# Patient Record
Sex: Male | Born: 1990 | Hispanic: No | Marital: Married | Smoking: Current some day smoker
Health system: Southern US, Community
[De-identification: ages and names within clinical notes are randomized; demographics above are authoritative.]

## PROBLEM LIST (undated history)

## (undated) DIAGNOSIS — J45909 Unspecified asthma, uncomplicated: Secondary | ICD-10-CM

## (undated) DIAGNOSIS — T783XXA Angioneurotic edema, initial encounter: Secondary | ICD-10-CM

## (undated) HISTORY — PX: HERNIA REPAIR: SHX51

## (undated) HISTORY — DX: Unspecified asthma, uncomplicated: J45.909

## (undated) HISTORY — DX: Angioneurotic edema, initial encounter: T78.3XXA

---
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
## 2008-04-03 IMAGING — CR DG WRIST COMPLETE 3+V*L*
4 series · 4 of 4 positions shown · non-contrast
Comparison: none

CLINICAL DATA: 16 year-old, arm pain. History of deformity left arm, injured playing soccer 2 days ago.
 LEFT WRIST ? 4 VIEW:

[x wrist pa left]
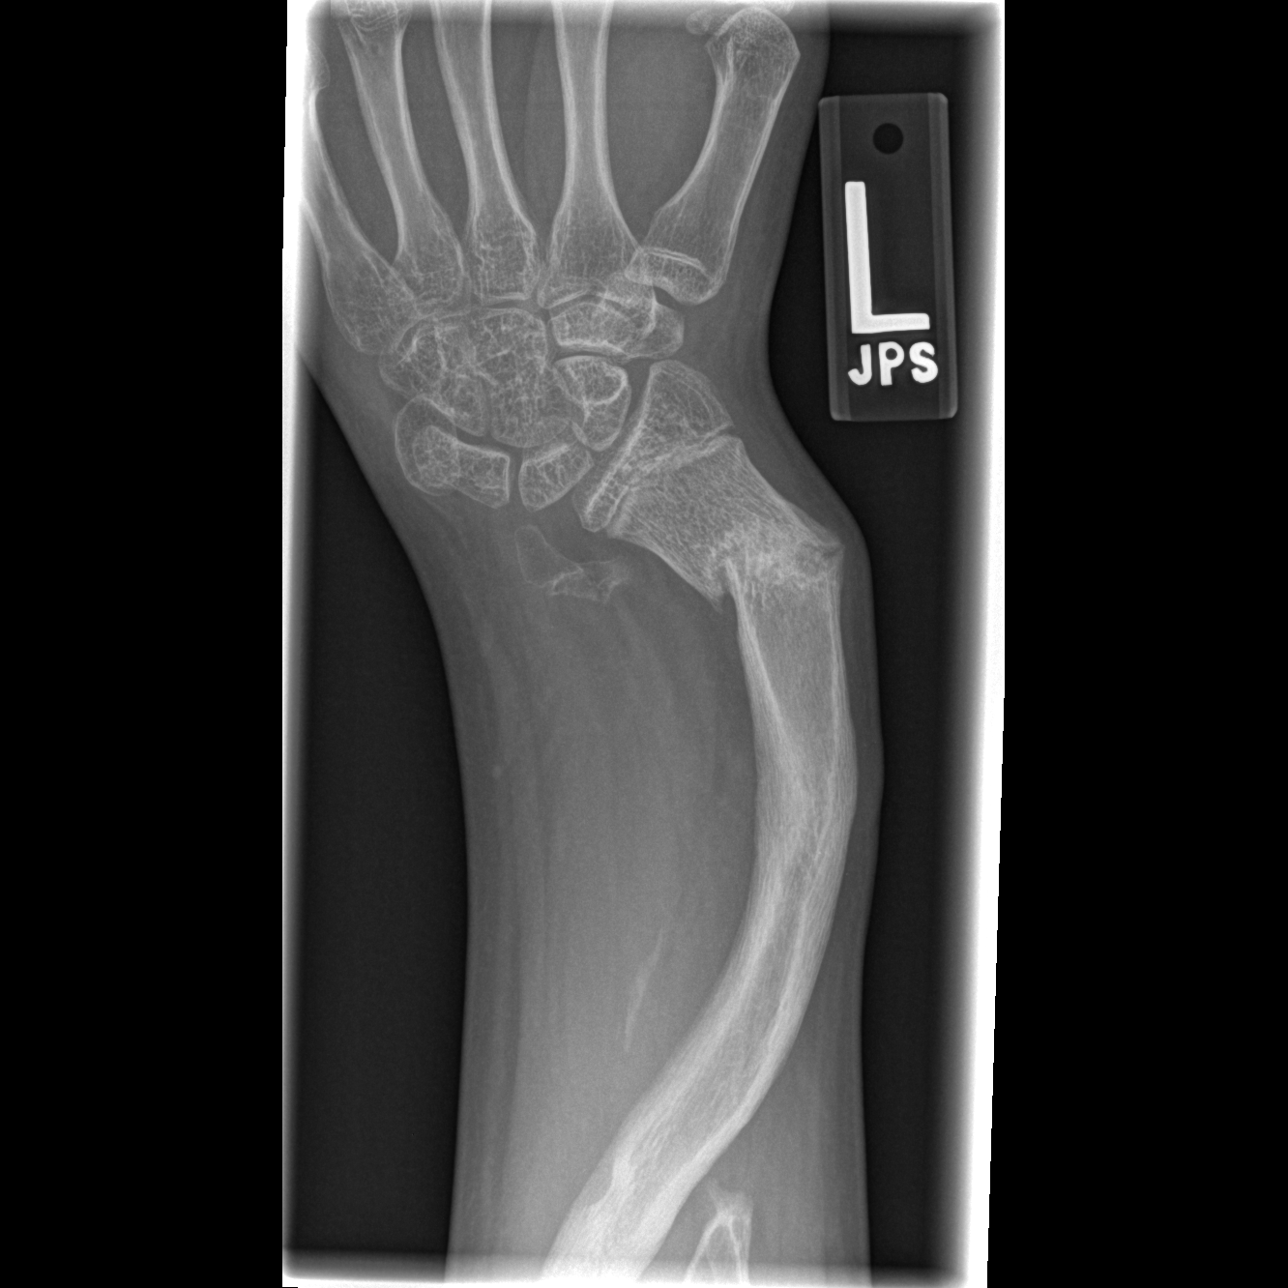

[x wrist obl left]
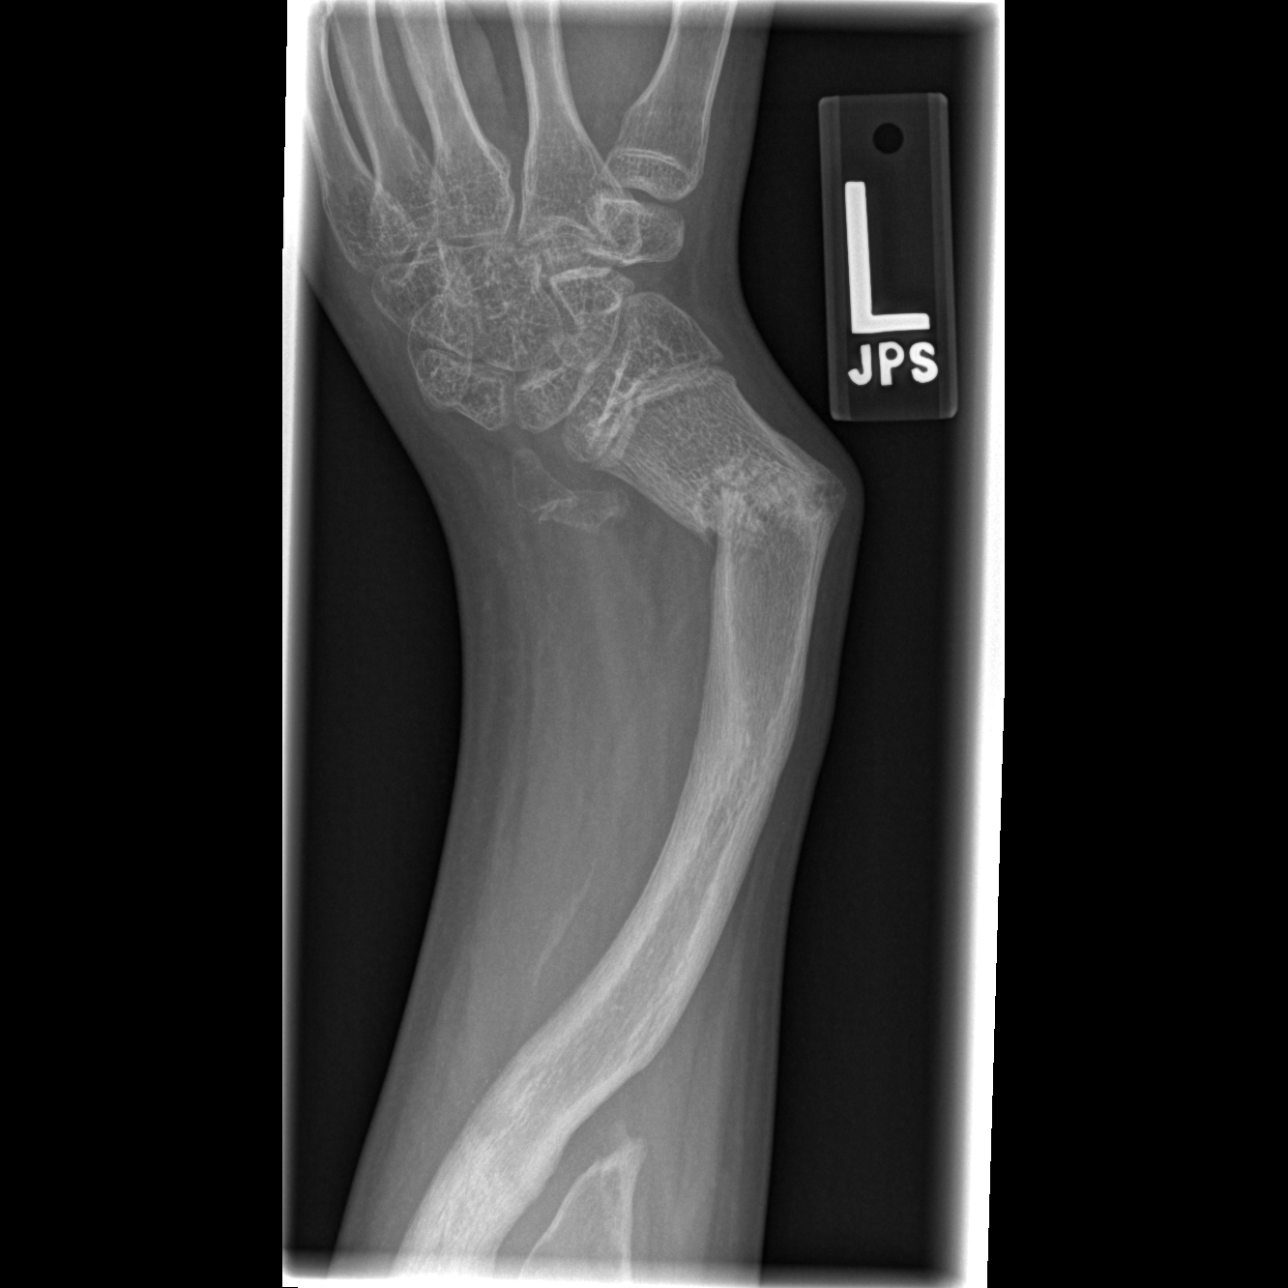

[x wrist lat left]
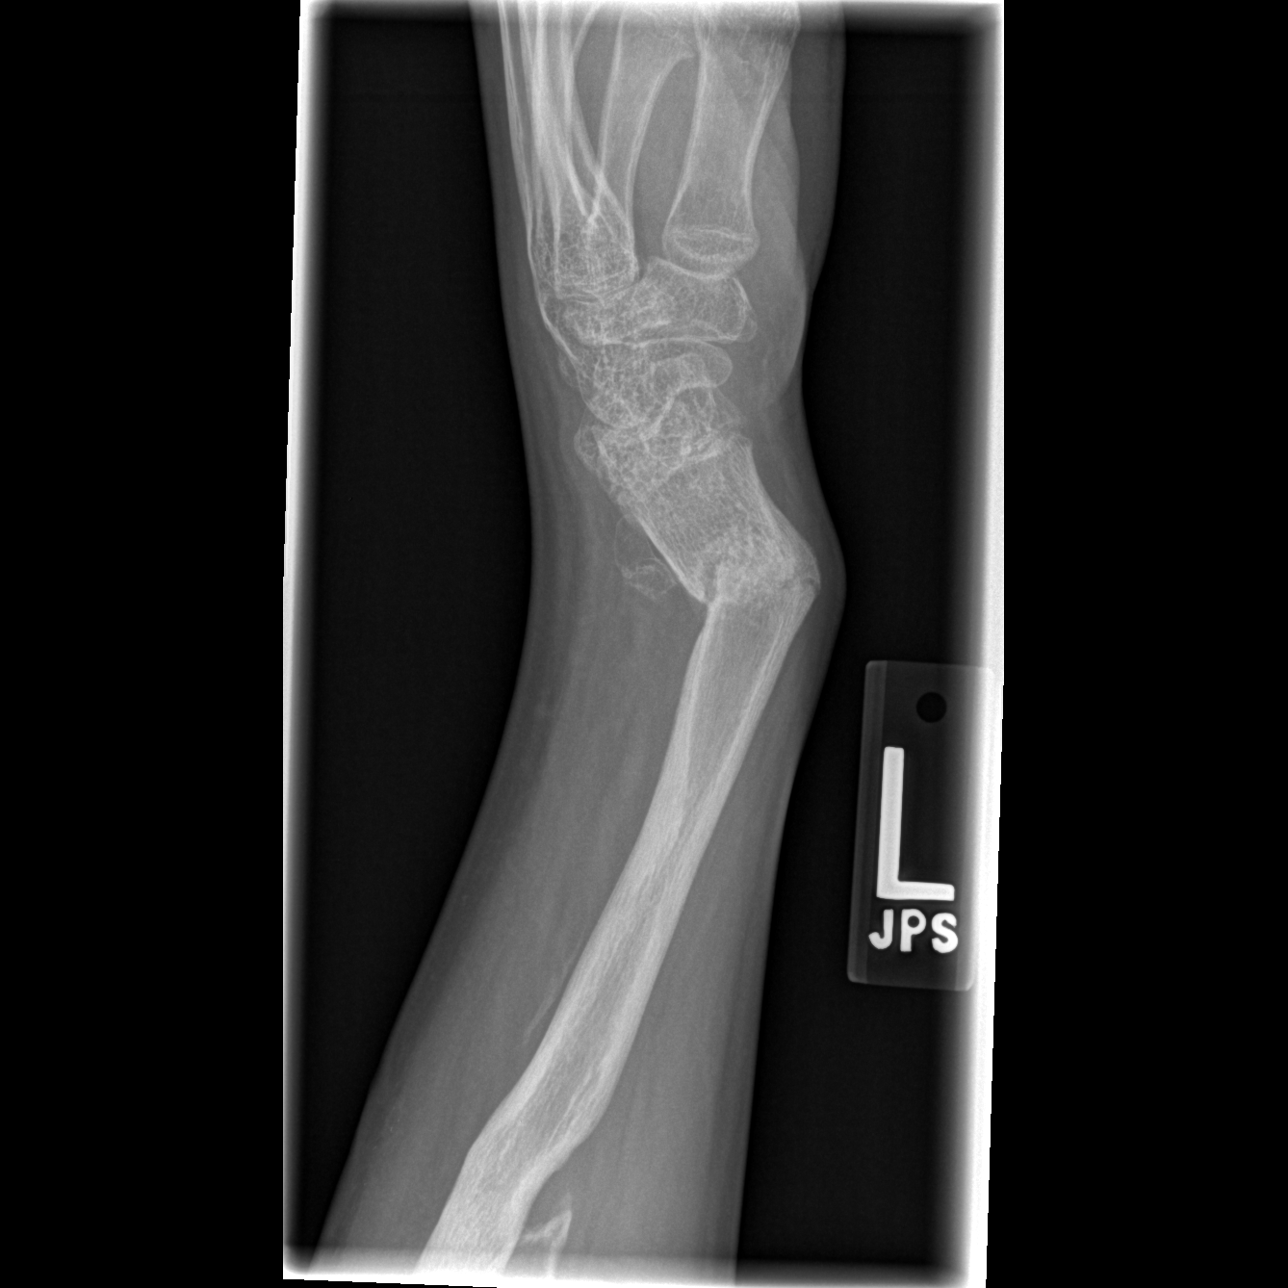

[x navicular]
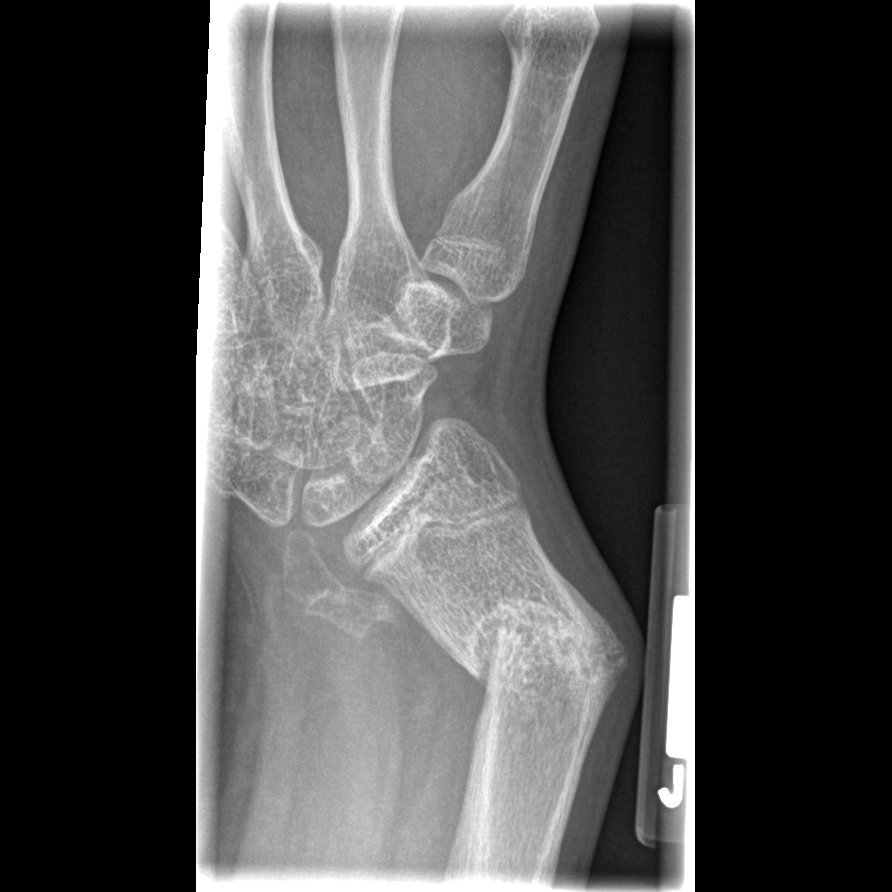

[4 of 4 positions shown; findings below may reference images not displayed]

FINDINGS: There is a severe curvature of the radius and only a small portion of the distal ulna is present.  There is a remote healed fracture of the distal radius.  No acute bony findings.
IMPRESSION: Congenital deformities of the forearm and wrist.  There is a remote healed fracture of the distal radius.  No acute bony findings.

## 2011-11-12 ENCOUNTER — Ambulatory Visit: Payer: Self-pay | Admitting: Family Medicine

## 2011-11-12 DIAGNOSIS — Z0000 Encounter for general adult medical examination without abnormal findings: Secondary | ICD-10-CM

## 2011-11-12 MED ORDER — MELOXICAM 7.5 MG PO TABS: 7.5000 mg | ORAL_TABLET | Freq: Two times a day (BID) | ORAL | Status: AC

## 2011-11-12 NOTE — Progress Notes (Signed)
  Subjective:    Patient ID: Adam Mcdaniel, male    DOB: [birth year], 21 y.o.   MRN: [id number]  HPI  Patient presents for CPE  Complains of (L) sided chest pain that is fleeting and intermitant. Pain started after lifting heavy palettes 3 months ago. Denies progression or radiation of symptoms No SOB, DOE, orthopnea, presyncope or lower extremity edema.  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  HENT:  Head: Normocephalic.  Right Ear: Hearing, tympanic membrane and external ear normal.  Left Ear: Hearing, tympanic membrane and external ear normal.  Nose: Nose normal.  Mouth/Throat: Oropharynx is clear and moist and mucous membranes are normal.  Eyes: EOM are normal. Pupils are equal, round, and reactive to light.  Neck: Normal range of motion. Neck supple. No thyromegaly present.  Cardiovascular: Normal rate, regular rhythm, normal heart sounds and normal pulses.   Pulmonary/Chest: Effort normal and breath sounds normal.  Abdominal: Soft. Bowel sounds are normal. There is no hepatosplenomegaly. Hernia confirmed negative in the right inguinal area and confirmed negative in the left inguinal area.  Genitourinary: Testes normal and penis normal. Right testis shows no mass. Left testis shows no mass.  Musculoskeletal: Normal range of motion.  Lymphadenopathy:    He has no cervical adenopathy.  Neurological: He is alert. He has normal strength. No cranial nerve deficit or sensory deficit.  Skin: Skin is warm.  Psychiatric: He has a normal mood and affect.    EKG NSR    Assessment & Plan:  CPE 1. Chest wall pain, musculoskeletal EKG 12-Lead, meloxicam (MOBIC) 7.5 MG tablet   Anticipatory  guidance

## 2012-04-29 ENCOUNTER — Ambulatory Visit: Payer: Self-pay | Admitting: Internal Medicine

## 2012-04-29 VITALS — BP 98/56 | HR 54 | Temp 97.8°F | Resp 14 | Ht 66.0 in | Wt 123.4 lb

## 2012-04-29 DIAGNOSIS — K469 Unspecified abdominal hernia without obstruction or gangrene: Secondary | ICD-10-CM

## 2012-04-29 NOTE — Patient Instructions (Addendum)
Inguinal Hernia, Adult Muscles help keep everything in the body in its proper place. But if a weak spot in the muscles develops, something can poke through. That is called a hernia. When this happens in the lower part of the belly (abdomen), it is called an inguinal hernia. (It takes its name from a part of the body in this region called the inguinal canal.) A weak spot in the wall of muscles lets some fat or part of the small intestine bulge through. An inguinal hernia can develop at any age. Men get them more often than women. CAUSES  In adults, an inguinal hernia develops over time.  It can be triggered by:   Suddenly straining the muscles of the lower abdomen.   Lifting heavy objects.   Straining to have a bowel movement. Difficult bowel movements (constipation) can lead to this.   Constant coughing. This may be caused by smoking or lung disease.   Being overweight.   Being pregnant.   Working at a job that requires long periods of standing or heavy lifting.   Having had an inguinal hernia before.  One type can be an emergency situation. It is called a strangulated inguinal hernia. It develops if part of the small intestine slips through the weak spot and cannot get back into the abdomen. The blood supply can be cut off. If that happens, part of the intestine may die. This situation requires emergency surgery. SYMPTOMS  Often, a small inguinal hernia has no symptoms. It is found when a healthcare provider does a physical exam. Larger hernias usually have symptoms.   In adults, symptoms may include:   A lump in the groin. This is easier to see when the person is standing. It might disappear when lying down.   In men, a lump in the scrotum.   Pain or burning in the groin. This occurs especially when lifting, straining or coughing.   A dull ache or feeling of pressure in the groin.   Signs of a strangulated hernia can include:   A bulge in the groin that becomes very painful  and tender to the touch.   A bulge that turns red or purple.   Fever, nausea and vomiting.   Inability to have a bowel movement or to pass gas.  DIAGNOSIS  To decide if you have an inguinal hernia, a healthcare provider will probably do a physical examination.  This will include asking questions about any symptoms you have noticed.   The healthcare provider might feel the groin area and ask you to cough. If an inguinal hernia is felt, the healthcare provider may try to slide it back into the abdomen.   Usually no other tests are needed.  TREATMENT  Treatments can vary. The size of the hernia makes a difference. Options include:  Watchful waiting. This is often suggested if the hernia is small and you have had no symptoms.   No medical procedure will be done unless symptoms develop.   You will need to watch closely for symptoms. If any occur, contact your healthcare provider right away.   Surgery. This is used if the hernia is larger or you have symptoms.   Open surgery. This is usually an outpatient procedure (you will not stay overnight in a hospital). An cut (incision) is made through the skin in the groin. The hernia is put back inside the abdomen. The weak area in the muscles is then repaired by herniorrhaphy or hernioplasty. Herniorrhaphy: in this type of   surgery, the weak muscles are sewn back together. Hernioplasty: a patch or mesh is used to close the weak area in the abdominal wall.   Laparoscopy. In this procedure, a surgeon makes small incisions. A thin tube with a tiny video camera (called a laparoscope) is put into the abdomen. The surgeon repairs the hernia with mesh by looking with the video camera and using two long instruments.  HOME CARE INSTRUCTIONS   After surgery to repair an inguinal hernia:   You will need to take pain medicine prescribed by your healthcare provider. Follow all directions carefully.   You will need to take care of the wound from the incision.    Your activity will be restricted for awhile. This will probably include no heavy lifting for several weeks. You also should not do anything too active for a few weeks. When you can return to work will depend on the type of job that you have.   During "watchful waiting" periods, you should:   Maintain a healthy weight.   Eat a diet high in fiber (fruits, vegetables and whole grains).   Drink plenty of fluids to avoid constipation. This means drinking enough water and other liquids to keep your urine clear or pale yellow.   Do not lift heavy objects.   Do not stand for long periods of time.   Quit smoking. This should keep you from developing a frequent cough.  SEEK MEDICAL CARE IF:   A bulge develops in your groin area.   You feel pain, a burning sensation or pressure in the groin. This might be worse if you are lifting or straining.   You develop a fever of more than 100.5 F (38.1 C).  SEEK IMMEDIATE MEDICAL CARE IF:   Pain in the groin increases suddenly.   A bulge in the groin gets bigger suddenly and does not go down.   For men, there is sudden pain in the scrotum. Or, the size of the scrotum increases.   A bulge in the groin area becomes red or purple and is painful to touch.   You have nausea or vomiting that does not go away.   You feel your heart beating much faster than normal.   You cannot have a bowel movement or pass gas.   You develop a fever of more than 102.0 F (38.9 C).  Document Released: 12/20/2008 Document Revised: 07/23/2011 Document Reviewed: 12/20/2008 [facility] Patient Information 2012 [place], [place].Hernia inguinal en adultos  Cuidados posteriores (Inguinal Hernia, Adult, Care After) Consulte este texto durante las prximas semanas. Estas indicaciones para despus de recibir el alta le proporcionan informacin general acerca de cmo deber cuidarse despus de dejar el hospital. El mdico le dar instrucciones especficas. El tratamiento se ha  planificado de acuerdo con las prcticas mdicas disponibles ms recientes, pero ocasionalmente pueden ocurrir complicaciones inevitables. Si tiene problemas o surgen preguntas luego de recibir el alta, por favor comunquese con su mdico. INSTRUCCIONES PARA EL CUIDADO DOMICILIARIO  Aplique hielo en el lugar de la operacin.   Ponga el hielo en una bolsa plstica.   Colquese una toalla entre la piel y la bolsa de hielo.   Deje el hielo durante 15 a 20 minutos por vez, 3 a 4 veces por da, mientras est despierto.   Cambie el vendaje tal como se le indic.   Mantenga la herida limpia y seca. Lave suavemente la herida con agua y [place]. Seque la herida con suaves toques. Puede tomar duchas 24 a 48  horas luego de Metallurgist. No tome baos, no utilice piscinas ni baeras durante [address], o segn las indicaciones del mdico.   Solo tome medicamentos de venta libre o recetados para Chief Technology Officer, Environmental health practitioner o la [place], segn le haya indicado el mdico.   Retome su dieta normal cuando le indiquen.   No levante objetos que pesen ms de 4 kg ni realice deportes de contacto durante 3 semanas, o segn las indicaciones.  SOLICITE ATENCIN MDICA SI:  Presenta enrojecimiento, hinchazn o aumento del dolor en la herida.   Observa lquido (pus) en la zona de la herida.   Hay un drenaje en la herida que dura ms de Civil engineer, contracting.   La temperatura oral se eleva sin motivo por arriba de 102 F (38.9 C).   Advierte un olor ftido que proviene de la herida o del vendaje.   La herida se abre luego de que le han extrado los puntos (suturas).   Nota un incremento del dolor en los hombros (en la zona donde van los tirantes).   Presenta episodios de mareos o se siente dbil cuando est de pie.   Si tiene Programme researcher, broadcasting/film/video (nuseas) o vmitos.  SOLICITE ATENCIN MDICA INMEDIATAMENTE SI:  Aparece una erupcin cutnea.   Presenta dificultades respiratorias.   Aparece alguna reaccin o efecto secundario  por los medicamentos administrados.  ASEGRESE QUE:  Comprende estas instrucciones.   Controlar su enfermedad.   Solicitar ayuda de inmediato si no mejora o empeora.  Document Released: 01/21/2010 Document Revised: 07/23/2011 [facility] Patient Information 2012 [place], [place].

## 2012-04-29 NOTE — Progress Notes (Signed)
  Subjective:    Patient ID: Adam Mcdaniel, male    DOB: 1991-06-12, 21 y.o.   MRN: [id number]  HPI Has right inguinal hernia   Review of Systems     Objective:   Physical Exam Right inguinal hernia, reducible      Assessment & Plan:  Refer to surgery Counceled

## 2012-05-13 ENCOUNTER — Encounter (INDEPENDENT_AMBULATORY_CARE_PROVIDER_SITE_OTHER): Payer: Self-pay | Admitting: General Surgery

## 2012-05-13 ENCOUNTER — Ambulatory Visit (INDEPENDENT_AMBULATORY_CARE_PROVIDER_SITE_OTHER): Payer: Self-pay | Admitting: General Surgery

## 2012-05-13 VITALS — BP 118/66 | HR 71 | Temp 98.2°F | Ht 68.0 in | Wt 120.0 lb

## 2012-05-13 DIAGNOSIS — K4090 Unilateral inguinal hernia, without obstruction or gangrene, not specified as recurrent: Secondary | ICD-10-CM

## 2012-05-13 NOTE — Progress Notes (Signed)
Subjective:     Patient ID: Adam Mcdaniel, male   DOB: 1991-08-10, 21 y.o.   MRN: [id number]  HPI The patient is a 21 year old male with a several week history of a right groin pain. Patient was noticed to have a right inguinal hernia by his PCP. This patient was scheduled for elective clinic appointment and possible fixation. The patient states he notices the pain and a bulge mainly after standing all day.  The patient states that upon awakening he does not notice bulge there.  Review of Systems  Constitutional: Negative.   HENT: Negative.   Eyes: Negative.   Respiratory: Negative.   Cardiovascular: Negative.   Gastrointestinal: Negative.   Musculoskeletal: Negative.   Neurological: Negative.        Objective:   Physical Exam  Constitutional: He is oriented to person, place, and time. He appears well-developed and well-nourished.  HENT:  Head: Normocephalic and atraumatic.  Eyes: Conjunctivae normal are normal. Pupils are equal, round, and reactive to light.  Neck: Normal range of motion. Neck supple.  Cardiovascular: Normal rate and regular rhythm.   Pulmonary/Chest: Effort normal.  Abdominal: Soft.  Musculoskeletal: Normal range of motion.  Neurological: He is alert and oriented to person, place, and time.       Assessment:     The patient is a 21 year old male with a right inguinal hernia. We discussed the risks and benefits of a right inguinal hernia, Open versus laparoscopic, patient wishes to proceed at this time hernia fixation.    Plan:     1. Schedule the patient for leftovers open repair with mesh.  2. All risks and benefits were discussed with the patient, to generally include infection, bleeding, damage to surrounding structures, and recurrence. Alternatives were offered and described.  All questions were answered and the patient voiced understanding of the procedure and wishes to proceed at this point.

## 2012-12-15 ENCOUNTER — Encounter (INDEPENDENT_AMBULATORY_CARE_PROVIDER_SITE_OTHER): Payer: Self-pay | Admitting: General Surgery

## 2012-12-15 ENCOUNTER — Ambulatory Visit (INDEPENDENT_AMBULATORY_CARE_PROVIDER_SITE_OTHER): Payer: Self-pay | Admitting: General Surgery

## 2012-12-15 VITALS — BP 110/62 | HR 84 | Resp 18 | Ht 68.0 in | Wt 118.2 lb

## 2012-12-15 DIAGNOSIS — K4090 Unilateral inguinal hernia, without obstruction or gangrene, not specified as recurrent: Secondary | ICD-10-CM

## 2012-12-15 NOTE — Progress Notes (Signed)
Patient ID: Adam Mcdaniel, male   DOB: 02-Jan-1991, 22 y.o.   MRN: [id number] HPI  The patient is a 22 year old male with a several week history of a right groin pain. Patient was noticed to have a right inguinal hernia by his PCP. This patient was scheduled for elective clinic appointment and possible fixation. The patient states he notices the pain and a bulge mainly after standing all day. The patient states that upon awakening he does not notice bulge there.  Review of Systems  Constitutional: Negative.  HENT: Negative.  Eyes: Negative.  Respiratory: Negative.  Cardiovascular: Negative.  Gastrointestinal: Negative.  Musculoskeletal: Negative.  Neurological: Negative.  Objective:   Physical Exam  Constitutional: He is oriented to person, place, and time. He appears well-developed and well-nourished.  HENT:  Head: Normocephalic and atraumatic.  Eyes: Conjunctivae normal are normal. Pupils are equal, round, and reactive to light.  Neck: Normal range of motion. Neck supple.  Cardiovascular: Normal rate and regular rhythm.  Pulmonary/Chest: Effort normal.  Abdominal: Soft.  Musculoskeletal: Normal range of motion.  Neurological: He is alert and oriented to person, place, and time.  Assessment:   The patient is a 22 year old male with a right inguinal hernia. We discussed the risks and benefits of a right inguinal hernia, Open versus laparoscopic, patient wishes to proceed at this time hernia fixation.  Plan:   1. Schedule the patient for Lap LIHR  with mesh.  2.All risks and benefits were discussed with the patient, to generally include infection, bleeding, damage to surrounding structures, acute and chronic nerve pain, and recurrence. Alternatives were offered and described.  All questions were answered and the patient voiced understanding of the procedure and wishes to proceed at this point.

## 2012-12-15 NOTE — Progress Notes (Deleted)
Patient ID: Adam Mcdaniel, male   DOB: 06-10-1991, 22 y.o.   MRN: [id number]  No chief complaint on file.   HPI Adam Mcdaniel is a 22 y.o. male.  *** HPI  History reviewed. No pertinent past medical history.  History reviewed. No pertinent past surgical history.  History reviewed. No pertinent family history.  Social History History  Substance Use Topics  . Smoking status: Never Smoker   . Smokeless tobacco: Not on file  . Alcohol Use: No    No Known Allergies  No current outpatient prescriptions on file.   No current facility-administered medications for this visit.    Review of Systems Review of Systems  Blood pressure 110/62, pulse 84, resp. rate 18, height 5\' 8"  (1.727 m), weight 118 lb 3.2 oz (53.615 kg).  Physical Exam Physical Exam  Data Reviewed ***  Assessment    ***    Plan    ***       Marigene Ehlers., Paiden Cavell 12/15/2012, 12:07 PM

## 2017-10-28 ENCOUNTER — Encounter: Payer: Self-pay | Admitting: Urgent Care

## 2017-10-28 ENCOUNTER — Ambulatory Visit: Payer: Self-pay | Admitting: Urgent Care

## 2017-10-28 VITALS — BP 122/75 | HR 56 | Temp 98.2°F | Resp 16 | Ht 68.0 in | Wt 143.0 lb

## 2017-10-28 DIAGNOSIS — R350 Frequency of micturition: Secondary | ICD-10-CM

## 2017-10-28 DIAGNOSIS — Z8719 Personal history of other diseases of the digestive system: Secondary | ICD-10-CM

## 2017-10-28 DIAGNOSIS — F524 Premature ejaculation: Secondary | ICD-10-CM

## 2017-10-28 LAB — POCT URINALYSIS DIP (MANUAL ENTRY)
BILIRUBIN UA: NEGATIVE mg/dL
Bilirubin, UA: NEGATIVE
Glucose, UA: NEGATIVE mg/dL
LEUKOCYTES UA: NEGATIVE
NITRITE UA: NEGATIVE
PROTEIN UA: NEGATIVE mg/dL
RBC UA: NEGATIVE
Spec Grav, UA: 1.015 (ref 1.010–1.025)
Urobilinogen, UA: 0.2 E.U./dL
pH, UA: 7.5 (ref 5.0–8.0)

## 2017-10-28 NOTE — Patient Instructions (Addendum)
Urinary Frequency, Adult Urinary frequency means urinating more often than usual. People with urinary frequency urinate at least 8 times in 24 hours, even if they drink a normal amount of fluid. Although they urinate more often than normal, the total amount of urine produced in a day may be normal. Urinary frequency is also called pollakiuria. What are the causes? This condition may be caused by:  A urinary tract infection.  Obesity.  Bladder problems, such as bladder stones.  Caffeine or alcohol.  Eating food or drinking fluids that irritate the bladder. These include coffee, tea, soda, artificial sweeteners, citrus, tomato-based foods, and chocolate.  Certain medicines, such as medicines that help the body get rid of extra fluid (diuretics).  Muscle or nerve weakness.  Overactive bladder.  Chronic diabetes.  Interstitial cystitis.  In men, problems with the prostate, such as an enlarged prostate.  In women, pregnancy.  In some cases, the cause may not be known. What increases the risk? This condition is more likely to develop in:  Women who have gone through menopause.  Men with prostate problems.  People with a disease or injury that affects the nerves or spinal cord.  People who have or have had a condition that affects the brain, such as a stroke.  What are the signs or symptoms? Symptoms of this condition include:  Feeling an urgent need to urinate often. The stress and anxiety of needing to find a bathroom quickly can make this urge worse.  Urinating 8 or more times in 24 hours.  Urinating as often as every 1 to 2 hours.  How is this diagnosed? This condition is diagnosed based on your symptoms, your medical history, and a physical exam. You may have tests, such as:  Blood tests.  Urine tests.  Imaging tests, such as X-rays or ultrasounds.  A bladder test.  A test of your neurological system. This is the body system that senses the need to  urinate.  A test to check for problems in the urethra and bladder called cystoscopy.  You may also be asked to keep a bladder diary. A bladder diary is a record of what you eat and drink, how often you urinate, and how much you urinate. You may need to see a health care provider who specializes in conditions of the urinary tract (urologist) or kidneys (nephrologist). How is this treated? Treatment for this condition depends on the cause. Sometimes the condition goes away on its own and treatment is not necessary. If treatment is needed, it may include:  Taking medicine.  Learning exercises that strengthen the muscles that help control urination.  Following a bladder training program. This may include: ? Learning to delay going to the bathroom. ? Double urinating (voiding). This helps if you are not completely emptying your bladder. ? Scheduled voiding.  Making diet changes, such as: ? Avoiding caffeine. ? Drinking fewer fluids, especially alcohol. ? Not drinking in the evening. ? Not having foods or drinks that may irritate the bladder. ? Eating foods that help prevent or ease constipation. Constipation can make this condition worse.  Having the nerves in your bladder stimulated. There are two options for stimulating the nerves to your bladder: ? Outpatient electrical nerve stimulation. This is done by your health care provider. ? Surgery to implant a bladder pacemaker. The pacemaker helps to control the urge to urinate.  Follow these instructions at home:  Keep a bladder diary if told to by your health care provider.  Take over-the-counter   and prescription medicines only as told by your health care provider.  Do any exercises as told by your health care provider.  Follow a bladder training program as told by your health care provider.  Make any recommended diet changes.  Keep all follow-up visits as told by your health care provider. This is important. Contact a health care  provider if:  You start urinating more often.  You feel pain or irritation when you urinate.  You notice blood in your urine.  Your urine looks cloudy.  You develop a fever.  You begin vomiting. Get help right away if:  You are unable to urinate. This information is not intended to replace advice given to you by your health care provider. Make sure you discuss any questions you have with your health care provider. Document Released: 05/30/2009 Document Revised: 09/04/2015 Document Reviewed: 02/27/2015 Elsevier Interactive Patient Education  2018 [employer]     IF you received an x-ray today, you will receive an invoice from [facility]. Please contact  Radiology at [phone] with questions or concerns regarding your invoice.   IF you received labwork today, you will receive an invoice from [facility]. Please contact [facility] at [phone] with questions or concerns regarding your invoice.   Our billing staff will not be able to assist you with questions regarding bills from these companies.  You will be contacted with the lab results as soon as they are available. The fastest way to get your results is to activate your My Chart account. Instructions are located on the last page of this paperwork. If you have not heard from us regarding the results in 2 weeks, please contact this office.      

## 2017-10-28 NOTE — Progress Notes (Signed)
    MRN: [id number] DOB: 01-16-1991  Subjective:   Adam Mcdaniel is a [age] y.o. male presenting for 3 year history of intermittent urinary frequency, premature ejaculation. Denies fever, polydipsia, n/v, abdominal pain, dysuria, hematuria, penile discharge, penile pain, testicular pain, genital rashes. Denies smoking cigarettes or drinking alcohol. He is sexually active with his wife only. Also reports that he has high sexual drive despite premature ejaculation and is very worried about his prostate. Would like to have this checked. Does not hydrate well. Drinks soda, coffee, sweet tea. Diet is a mix of foods.   Adam Mcdaniel is not currently taking any medications and has No Known Allergies.  Adam Mcdaniel has a history of right inguinal hernia, s/p inguinal hernia repair in 2014. Denies family history of cancer, diabetes, HTN, HL, heart disease, stroke, mental illness.   Objective:   Vitals: BP 122/75   Pulse (!) 56   Temp 98.2 F (36.8 C) (Oral)   Resp 16   Ht 5\' 8"  (1.727 m)   Wt 143 lb (64.9 kg)   SpO2 99%   BMI 21.74 kg/m   Physical Exam  Constitutional: He is oriented to person, place, and time. He appears well-developed and well-nourished.  HENT:  Mouth/Throat: Oropharynx is clear and moist.  Eyes: No scleral icterus.  Neck: Normal range of motion. Neck supple. No thyromegaly present.  Cardiovascular: Normal rate, regular rhythm and intact distal pulses. Exam reveals no gallop and no friction rub.  No murmur heard. Pulmonary/Chest: No respiratory distress. He has no wheezes. He has no rales.  Abdominal: He exhibits no distension and no mass. There is no tenderness. There is no guarding.  Genitourinary: Prostate is not enlarged and not tender.  Neurological: He is alert and oriented to person, place, and time.  Skin: Skin is warm and dry.  Psychiatric:  Anxious demeanor.   Results for orders placed or performed in visit on 10/28/17 (from the past 24 hour(s))  POCT urinalysis dipstick      Status: None   Collection Time: 10/28/17  5:03 PM  Result Value Ref Range   Color, UA yellow yellow   Clarity, UA clear clear   Glucose, UA negative negative mg/dL   Bilirubin, UA negative negative   Ketones, POC UA negative negative mg/dL   Spec Grav, UA [id number] 1.1911.010 - 1.025   Blood, UA negative negative   pH, UA 7.5 5.0 - 8.0   Protein Ur, POC negative negative mg/dL   Urobilinogen, UA 0.2 0.2 or 1.0 E.U./dL   Nitrite, UA Negative Negative   Leukocytes, UA Negative Negative   Assessment and Plan :   Urinary frequency - Plan: POCT urinalysis dipstick, Basic metabolic panel, Hemoglobin A1c, Urine Culture, GC/Chlamydia Probe Amp([facility])  Premature ejaculation - Plan: POCT urinalysis dipstick  History of inguinal hernia  Counseled patient on differential. Discussed indications for digital rectal exam but patient insisted due to concern of his prostate. Reassured patient on his exam. Labs pending, will have patient cut back on fluids that are urinary irritants. Recheck in 1 week. Return-to-clinic precautions discussed, patient verbalized understanding.   Wallis BambergMario Khilynn Borntreger, PA-C [facility] at [facility] [phone] 10/28/2017  4:35 PM

## 2017-10-29 LAB — BASIC METABOLIC PANEL
BUN/Creatinine Ratio: 12 (ref 9–20)
BUN: 9 mg/dL (ref 6–20)
CALCIUM: 9.6 mg/dL (ref 8.7–10.2)
CO2: 24 mmol/L (ref 20–29)
Chloride: 100 mmol/L (ref 96–106)
Creatinine, Ser: 0.74 mg/dL — ABNORMAL LOW (ref 0.76–1.27)
GFR calc non Af Amer: 125 mL/min/{1.73_m2} (ref >59)
GFR, EST AFRICAN AMERICAN: 144 mL/min/{1.73_m2} (ref >59)
Glucose: 84 mg/dL (ref 65–99)
POTASSIUM: 4.2 mmol/L (ref 3.5–5.2)
Sodium: 141 mmol/L (ref 134–144)

## 2017-10-29 LAB — GC/CHLAMYDIA PROBE AMP
Chlamydia trachomatis, NAA: NEGATIVE
Neisseria gonorrhoeae by PCR: NEGATIVE

## 2017-10-29 LAB — URINE CULTURE: ORGANISM ID, BACTERIA: NO GROWTH

## 2017-10-29 LAB — HEMOGLOBIN A1C
ESTIMATED AVERAGE GLUCOSE: 111 mg/dL
HEMOGLOBIN A1C: 5.5 % (ref 4.8–5.6)

## 2017-11-06 ENCOUNTER — Ambulatory Visit: Payer: Self-pay | Admitting: Urgent Care

## 2017-11-06 ENCOUNTER — Telehealth: Payer: Self-pay | Admitting: Urgent Care

## 2017-11-06 ENCOUNTER — Ambulatory Visit (INDEPENDENT_AMBULATORY_CARE_PROVIDER_SITE_OTHER): Payer: Self-pay | Admitting: Urgent Care

## 2017-11-06 ENCOUNTER — Other Ambulatory Visit: Payer: Self-pay | Admitting: Urgent Care

## 2017-11-06 DIAGNOSIS — Z130 Encounter for screening for diseases of the blood and blood-forming organs and certain disorders involving the immune mechanism: Secondary | ICD-10-CM

## 2017-11-06 DIAGNOSIS — Z13220 Encounter for screening for lipoid disorders: Secondary | ICD-10-CM

## 2017-11-06 DIAGNOSIS — Z1329 Encounter for screening for other suspected endocrine disorder: Secondary | ICD-10-CM

## 2017-11-06 DIAGNOSIS — Z114 Encounter for screening for human immunodeficiency virus [HIV]: Secondary | ICD-10-CM

## 2017-11-06 NOTE — Progress Notes (Signed)
Nurse visit for labs only

## 2017-11-07 LAB — CBC
HEMATOCRIT: 49 % (ref 37.5–51.0)
Hemoglobin: 16.1 g/dL (ref 13.0–17.7)
MCH: 28.2 pg (ref 26.6–33.0)
MCHC: 32.9 g/dL (ref 31.5–35.7)
MCV: 86 fL (ref 79–97)
Platelets: 298 10*3/uL (ref 150–379)
RBC: 5.7 x10E6/uL (ref 4.14–5.80)
RDW: 13.8 % (ref 12.3–15.4)
WBC: 5.2 10*3/uL (ref 3.4–10.8)

## 2017-11-07 LAB — LIPID PANEL
CHOL/HDL RATIO: 4.2 ratio (ref 0.0–5.0)
Cholesterol, Total: 190 mg/dL (ref 100–199)
HDL: 45 mg/dL (ref >39)
LDL CALC: 126 mg/dL — AB (ref 0–99)
TRIGLYCERIDES: 95 mg/dL (ref 0–149)
VLDL Cholesterol Cal: 19 mg/dL (ref 5–40)

## 2017-11-07 LAB — TSH: TSH: 3.13 u[IU]/mL (ref 0.450–4.500)

## 2017-11-07 LAB — HIV ANTIBODY (ROUTINE TESTING W REFLEX): HIV Screen 4th Generation wRfx: NONREACTIVE

## 2017-11-08 NOTE — Telephone Encounter (Signed)
error 

## 2017-11-09 ENCOUNTER — Encounter: Payer: Self-pay | Admitting: Urgent Care

## 2017-11-09 ENCOUNTER — Other Ambulatory Visit: Payer: Self-pay

## 2017-11-09 ENCOUNTER — Ambulatory Visit: Payer: Self-pay | Admitting: Urgent Care

## 2017-11-09 VITALS — BP 96/60 | HR 59 | Temp 98.3°F | Resp 17 | Ht 65.5 in | Wt 141.8 lb

## 2017-11-09 DIAGNOSIS — R350 Frequency of micturition: Secondary | ICD-10-CM

## 2017-11-09 DIAGNOSIS — Z0000 Encounter for general adult medical examination without abnormal findings: Secondary | ICD-10-CM

## 2017-11-09 DIAGNOSIS — Z8719 Personal history of other diseases of the digestive system: Secondary | ICD-10-CM

## 2017-11-09 DIAGNOSIS — Z23 Encounter for immunization: Secondary | ICD-10-CM

## 2017-11-09 DIAGNOSIS — F524 Premature ejaculation: Secondary | ICD-10-CM

## 2017-11-09 LAB — POCT URINALYSIS DIP (MANUAL ENTRY)
BILIRUBIN UA: NEGATIVE
BILIRUBIN UA: NEGATIVE mg/dL
Blood, UA: NEGATIVE
GLUCOSE UA: NEGATIVE mg/dL
LEUKOCYTES UA: NEGATIVE
NITRITE UA: NEGATIVE
Protein Ur, POC: NEGATIVE mg/dL
Spec Grav, UA: >=1.03 — AB (ref 1.010–1.025)
Urobilinogen, UA: 0.2 E.U./dL
pH, UA: 5.5 (ref 5.0–8.0)

## 2017-11-09 NOTE — Patient Instructions (Addendum)
Health Maintenance, Male A healthy lifestyle and preventive care is important for your health and wellness. Ask your health care provider about what schedule of regular examinations is right for you. What should I know about weight and diet? Eat a Healthy Diet  Eat plenty of vegetables, fruits, whole grains, low-fat dairy products, and lean protein.  Do not eat a lot of foods high in solid fats, added sugars, or salt.  Maintain a Healthy Weight Regular exercise can help you achieve or maintain a healthy weight. You should:  Do at least 150 minutes of exercise each week. The exercise should increase your heart rate and make you sweat (moderate-intensity exercise).  Do strength-training exercises at least twice a week.  Watch Your Levels of Cholesterol and Blood Lipids  Have your blood tested for lipids and cholesterol every 5 years starting at 29 years of age. If you are at high risk for heart disease, you should start having your blood tested when you are 29 years old. You may need to have your cholesterol levels checked more often if: ? Your lipid or cholesterol levels are high. ? You are older than 29 years of age. ? You are at high risk for heart disease.  What should I know about cancer screening? Many types of cancers can be detected early and may often be prevented. Lung Cancer  You should be screened every year for lung cancer if: ? You are a current smoker who has smoked for at least 30 years. ? You are a former smoker who has quit within the past 15 years.  Talk to your health care provider about your screening options, when you should start screening, and how often you should be screened.  Colorectal Cancer  Routine colorectal cancer screening usually begins at 29 years of age and should be repeated every 5-10 years until you are 29 years old. You may need to be screened more often if early forms of precancerous polyps or small growths are found. Your health care provider  may recommend screening at an earlier age if you have risk factors for colon cancer.  Your health care provider may recommend using home test kits to check for hidden blood in the stool.  A small camera at the end of a tube can be used to examine your colon (sigmoidoscopy or colonoscopy). This checks for the earliest forms of colorectal cancer.  Prostate and Testicular Cancer  Depending on your age and overall health, your health care provider may do certain tests to screen for prostate and testicular cancer.  Talk to your health care provider about any symptoms or concerns you have about testicular or prostate cancer.  Skin Cancer  Check your skin from head to toe regularly.  Tell your health care provider about any new moles or changes in moles, especially if: ? There is a change in a mole's size, shape, or color. ? You have a mole that is larger than a pencil eraser.  Always use sunscreen. Apply sunscreen liberally and repeat throughout the day.  Protect yourself by wearing long sleeves, pants, a wide-brimmed hat, and sunglasses when outside.  What should I know about heart disease, diabetes, and high blood pressure?  If you are 18-39 years of age, have your blood pressure checked every 3-5 years. If you are 40 years of age or older, have your blood pressure checked every year. You should have your blood pressure measured twice-once when you are at a hospital or clinic, and once when   you are not at a hospital or clinic. Record the average of the two measurements. To check your blood pressure when you are not at a hospital or clinic, you can use: ? An automated blood pressure machine at a pharmacy. ? A home blood pressure monitor.  Talk to your health care provider about your target blood pressure.  If you are between 45-79 years old, ask your health care provider if you should take aspirin to prevent heart disease.  Have regular diabetes screenings by checking your fasting blood  sugar level. ? If you are at a normal weight and have a low risk for diabetes, have this test once every three years after the age of 45. ? If you are overweight and have a high risk for diabetes, consider being tested at a younger age or more often.  A one-time screening for abdominal aortic aneurysm (AAA) by ultrasound is recommended for men aged 65-75 years who are current or former smokers. What should I know about preventing infection? Hepatitis B If you have a higher risk for hepatitis B, you should be screened for this virus. Talk with your health care provider to find out if you are at risk for hepatitis B infection. Hepatitis C Blood testing is recommended for:  Everyone born from 1945 through 1965.  Anyone with known risk factors for hepatitis C.  Sexually Transmitted Diseases (STDs)  You should be screened each year for STDs including gonorrhea and chlamydia if: ? You are sexually active and are younger than 29 years of age. ? You are older than 29 years of age and your health care provider tells you that you are at risk for this type of infection. ? Your sexual activity has changed since you were last screened and you are at an increased risk for chlamydia or gonorrhea. Ask your health care provider if you are at risk.  Talk with your health care provider about whether you are at high risk of being infected with HIV. Your health care provider may recommend a prescription medicine to help prevent HIV infection.  What else can I do?  Schedule regular health, dental, and eye exams.  Stay current with your vaccines (immunizations).  Do not use any tobacco products, such as cigarettes, chewing tobacco, and e-cigarettes. If you need help quitting, ask your health care provider.  Limit alcohol intake to no more than 2 drinks per day. One drink equals 12 ounces of beer, 5 ounces of wine, or 1 ounces of hard liquor.  Do not use street drugs.  Do not share needles.  Ask your  health care provider for help if you need support or information about quitting drugs.  Tell your health care provider if you often feel depressed.  Tell your health care provider if you have ever been abused or do not feel safe at home. This information is not intended to replace advice given to you by your health care provider. Make sure you discuss any questions you have with your health care provider. Document Released: 01/30/2008 Document Revised: 04/01/2016 Document Reviewed: 05/07/2015 Elsevier Interactive Patient Education  2018 Elsevier Inc.     IF you received an x-ray today, you will receive an invoice from [facility]. Please contact  Radiology at [phone] with questions or concerns regarding your invoice.   IF you received labwork today, you will receive an invoice from [facility]. Please contact [facility] at [phone] with questions or concerns regarding your invoice.   Our billing staff will not be   able to assist you with questions regarding bills from these companies.  You will be contacted with the lab results as soon as they are available. The fastest way to get your results is to activate your My Chart account. Instructions are located on the last page of this paperwork. If you have not heard from us regarding the results in 2 weeks, please contact this office.       

## 2017-11-09 NOTE — Progress Notes (Signed)
MRN: [id number]  Subjective:   [name] is a 27 y.o. male presenting for annual physical exam and follow up premature ejaculation. Patient is married, has 1 child. Has good relationships at home, has a good support network. Works as an Artist. Drinks a lot of coffee, sodas, tries to hydrate with water as well. Denies smoking cigarettes or drinking alcohol.   Medical care team includes: PCP: System, Provider Not In Vision: No visual deficits. Dental: No dental care. Specialists: None.   Health Maintenance: Needs tdap updated.  Adam Mcdaniel is not currently taking any medications and has No Known Allergies. Adam Mcdaniel  has no past medical history on file. Also  has no past surgical history on file. Denies family history of cancer, diabetes, HTN, HL, heart disease, stroke, mental illness.   Review of Systems  Constitutional: Negative for chills, diaphoresis, fever, malaise/fatigue and weight loss.  HENT: Negative for congestion, ear discharge, ear pain, hearing loss, nosebleeds, sore throat and tinnitus.   Eyes: Negative for blurred vision, double vision, photophobia, pain, discharge and redness.  Respiratory: Negative for cough, shortness of breath and wheezing.   Cardiovascular: Negative for chest pain, palpitations and leg swelling.  Gastrointestinal: Negative for abdominal pain, blood in stool, constipation, diarrhea, nausea and vomiting.  Genitourinary: Negative for dysuria, flank pain, frequency, hematuria and urgency.  Musculoskeletal: Negative for back pain, joint pain and myalgias.  Skin: Negative for itching and rash.  Neurological: Negative for dizziness, tingling, seizures, loss of consciousness, weakness and headaches.  Endo/Heme/Allergies: Negative for polydipsia.  Psychiatric/Behavioral: Negative for depression, hallucinations, memory loss, substance abuse and suicidal ideas. The patient is not nervous/anxious and does not have insomnia.    Objective:    Vitals: BP 96/60 (BP Location: Right Arm, Patient Position: Sitting, Cuff Size: Normal)   Pulse (!) 59   Temp 98.3 F (36.8 C) (Oral)   Resp 17   Ht 5' 5.5" (1.664 m)   Wt 141 lb 12.8 oz (64.3 kg)   SpO2 98%   BMI 23.24 kg/m    Visual Acuity Screening   Right eye Left eye Both eyes  Without correction: 20/20 20/20 20/20   With correction:       Physical Exam  Constitutional: He is oriented to person, place, and time. He appears well-developed and well-nourished.  HENT:  TM's intact bilaterally, no effusions or erythema. Nasal turbinates pink and moist, nasal passages patent. No sinus tenderness. Oropharynx clear, mucous membranes moist, dentition in good repair.  Eyes: Pupils are equal, round, and reactive to light. Conjunctivae and EOM are normal. Right eye exhibits no discharge. Left eye exhibits no discharge. No scleral icterus.  Neck: Normal range of motion. Neck supple. No thyromegaly present.  Cardiovascular: Normal rate, regular rhythm and intact distal pulses. Exam reveals no gallop and no friction rub.  No murmur heard. Pulmonary/Chest: No stridor. No respiratory distress. He has no wheezes. He has no rales.  Abdominal: Soft. Bowel sounds are normal. He exhibits no distension and no mass. There is no tenderness.  Musculoskeletal: Normal range of motion. He exhibits no edema or tenderness.  Lymphadenopathy:    He has no cervical adenopathy.  Neurological: He is alert and oriented to person, place, and time. He has normal reflexes. He displays normal reflexes.  Skin: Skin is warm and dry. No rash noted. No erythema. No pallor.  Psychiatric: He has a normal mood and affect.   Results for orders placed or performed in visit on 11/09/17 (from the past 24 hour(s))  POCT urinalysis dipstick     Status: Abnormal   Collection Time: 11/09/17  3:58 PM  Result Value Ref Range   Color, UA yellow yellow   Clarity, UA clear clear   Glucose, UA negative negative mg/dL    Bilirubin, UA negative negative   Ketones, POC UA negative negative mg/dL   Spec Grav, UA >=0.454>=1.030 (A) 1.010 - 1.025   Blood, UA negative negative   pH, UA 5.5 5.0 - 8.0   Protein Ur, POC negative negative mg/dL   Urobilinogen, UA 0.2 0.2 or 1.0 E.U./dL   Nitrite, UA Negative Negative   Leukocytes, UA Negative Negative   Assessment and Plan :   Annual physical exam  Urinary frequency - Plan: POCT urinalysis dipstick, Ambulatory referral to Urology  Premature ejaculation - Plan: Ambulatory referral to Urology  History of inguinal hernia  Will refer to urology for consult on premature ejaculation, urinary symptoms. However, I recommended patient drink less urinary irritants. Labs from annual physical reviewed. Immunizations updated. Discussed healthy lifestyle, diet, exercise, preventative care, vaccinations, and addressed patient's concerns. Follow up in ~3 months or after urology referral if urinary symptoms, premature ejaculation persists.  Wallis BambergMario Sarahann Horrell, PA-C Primary Care at [facility] [phone] 11/09/2017  3:53 PM

## 2018-02-09 ENCOUNTER — Telehealth: Payer: Self-pay | Admitting: Urgent Care

## 2018-02-09 NOTE — Telephone Encounter (Signed)
Informed pt that labs are normal.  

## 2018-02-09 NOTE — Telephone Encounter (Signed)
Pt came in to get his lab results, from what I saw they have not been release yet. CB [phone]

## 2021-09-28 ENCOUNTER — Emergency Department (HOSPITAL_COMMUNITY)
Admission: EM | Admit: 2021-09-28 | Discharge: 2021-09-28 | Disposition: A | Discharge status: Home / Self Care | Payer: 59 | Attending: Emergency Medicine | Admitting: Emergency Medicine

## 2021-09-28 ENCOUNTER — Emergency Department (HOSPITAL_COMMUNITY): Payer: 59

## 2021-09-28 ENCOUNTER — Other Ambulatory Visit: Payer: Self-pay

## 2021-09-28 ENCOUNTER — Encounter (HOSPITAL_COMMUNITY): Payer: Self-pay

## 2021-09-28 DIAGNOSIS — Z87891 Personal history of nicotine dependence: Secondary | ICD-10-CM | POA: Diagnosis not present

## 2021-09-28 DIAGNOSIS — R059 Cough, unspecified: Secondary | ICD-10-CM | POA: Diagnosis present

## 2021-09-28 DIAGNOSIS — R051 Acute cough: Secondary | ICD-10-CM | POA: Insufficient documentation

## 2021-09-28 DIAGNOSIS — Z20822 Contact with and (suspected) exposure to covid-19: Secondary | ICD-10-CM | POA: Insufficient documentation

## 2021-09-28 LAB — RESP PANEL BY RT-PCR (FLU A&B, COVID) ARPGX2
Influenza A by PCR: NEGATIVE
Influenza B by PCR: NEGATIVE
SARS Coronavirus 2 by RT PCR: NEGATIVE

## 2021-09-28 MED ORDER — BENZONATATE 100 MG PO CAPS
200.0000 mg | ORAL_CAPSULE | Freq: Once | ORAL | Status: AC
  Administered 2021-09-28: 200 mg via ORAL
  Filled 2021-09-28: qty 2

## 2021-09-28 MED ORDER — DEXAMETHASONE 4 MG PO TABS
6.0000 mg | ORAL_TABLET | Freq: Once | ORAL | Status: AC
  Administered 2021-09-28: 6 mg via ORAL
  Filled 2021-09-28: qty 2

## 2021-09-28 MED ORDER — BENZONATATE 100 MG PO CAPS: 100.0000 mg | ORAL_CAPSULE | Freq: Three times a day (TID) | ORAL | 0 refills | Status: DC

## 2021-09-28 NOTE — Discharge Instructions (Addendum)
I think you have a viral bronchitis.  This will pass on its own, antibiotics do not help.  You were given a shot of steroids here in the ED.  Take the cough medicine as needed every 8 hours at home.  Chest x-ray looked good and did not show any signs of a bacterial infection or pneumonia.

## 2021-09-28 NOTE — ED Provider Notes (Signed)
[facility] Provider Note   CSN: [id number] Arrival date & time: 09/28/21  1317     History  No chief complaint on file.   Adam Mcdaniel is a 31 y.o. male.  HPI  31 year old male with no contributing medical history presenting due to cough x3 weeks.  Reports it was initially green sputum, then moved to clear sputum and now is green again.  Denies any shortness of breath or chest pain, does not smoke cigarettes (history of cigarette use 13 years prior).  He does occasionally smoke hookah but has abstained for the last 4 months.  Nobody around him is sick, not having any fevers at home.  Home Medications Prior to Admission medications   Medication Sig Start Date End Date Taking? Authorizing Provider  benzonatate (TESSALON) 100 MG capsule Take 1 capsule (100 mg total) by mouth every 8 (eight) hours. 09/28/21  Yes Sherrill Raring, PA-C      Allergies    Patient has no known allergies.    Review of Systems   Review of Systems  Respiratory:  Positive for cough.    Physical Exam Updated Vital Signs BP (!) 139/95    Pulse 81    Temp 98 F (36.7 C) (Oral)    Resp 15    SpO2 98%  Physical Exam Vitals and nursing note reviewed. Exam conducted with a chaperone present.  Constitutional:      Appearance: Normal appearance.  HENT:     Head: Normocephalic and atraumatic.  Eyes:     General: No scleral icterus.       Right eye: No discharge.        Left eye: No discharge.     Extraocular Movements: Extraocular movements intact.     Pupils: Pupils are equal, round, and reactive to light.  Cardiovascular:     Rate and Rhythm: Normal rate and regular rhythm.     Pulses: Normal pulses.     Heart sounds: Normal heart sounds. No murmur heard.   No friction rub. No gallop.  Pulmonary:     Effort: Pulmonary effort is normal. No respiratory distress.     Breath sounds: Normal breath sounds.  Abdominal:     General: Abdomen is flat. Bowel sounds are  normal. There is no distension.     Palpations: Abdomen is soft.     Tenderness: There is no abdominal tenderness.  Skin:    General: Skin is warm and dry.     Coloration: Skin is not jaundiced.  Neurological:     Mental Status: He is alert. Mental status is at baseline.     Coordination: Coordination normal.    ED Results / Procedures / Treatments   Labs (all labs ordered are listed, but only abnormal results are displayed) Labs Reviewed  RESP PANEL BY RT-PCR (FLU A&B, COVID) [id number]    EKG None  Radiology DG Chest 2 View  Result Date: 09/28/2021 CLINICAL DATA:  Cough EXAM: CHEST - 2 VIEW COMPARISON:  None. FINDINGS: The heart size and mediastinal contours are within normal limits. Both lungs are clear. The visualized skeletal structures are unremarkable. IMPRESSION: No active cardiopulmonary disease. Electronically Signed   By: Davina Poke D.O.   On: 09/28/2021 14:23    Procedures Procedures    Medications Ordered in ED Medications  dexamethasone (DECADRON) tablet 6 mg (has no administration in time range)  benzonatate (TESSALON) capsule 200 mg (200 mg Oral Given 09/28/21 1359)    ED Course/ Medical  Decision Making/ A&P                           Medical Decision Making Amount and/or Complexity of Data Reviewed Radiology: ordered.  Risk Prescription drug management.   This patient presents to the ED for concern of cough, this involves an extensive number of treatment options, and is a complaint that carries with it a high risk of complications and morbidity.  The differential diagnosis includes viral process, pneumonia, oncologic process, other   Co morbidities that complicate the patient evaluation: N/A   Additional history obtained: -Additional history obtained from friend -External records from outside source obtained and reviewed including: Chart review including previous notes, labs, imaging, consultation notes   Imaging Studies ordered: -I ordered  imaging studies including chest x-ray -I independently visualized and interpreted imaging which showed normal cardiac silhouette without any underlying cardiac enlargement -I agree with the radiologist interpretation   Medicines ordered and prescription drug management: -I ordered medication including tessalon for cough -Reevaluation of the patient after these medicines showed that the patient improved -I have reviewed the patients home medicines and have made adjustments as needed   ED Course: No hypoxia or tachypnea, lungs are clear to auscultation bilaterally.  Do not suspect any acute emergent process.  The chest x-ray does not show an underlying bacterial process.  I suspect he is having more of a viral bronchitis.  Given no oncologic history in 3 weeks acuity I do not suspect any malignant process.  Do not feel he needs additional work-up at this time, discussed with patient who is in agreement.  He was given steroids as well as Tessalon here in the ED.  Will discharge with cough medicine and have him follow-up with his PCP as needed.   Reevaluation: After the interventions noted above, I reevaluated the patient and found that they have :improved   Dispostion: D/C         Final Clinical Impression(s) / ED Diagnoses Final diagnoses:  Acute cough    Rx / DC Orders ED Discharge Orders          Ordered    benzonatate (TESSALON) 100 MG capsule  Every 8 hours        09/28/21 1431              Sherrill Raring, PA-C 09/28/21 1435    Pattricia Boss, MD 09/28/21 2034

## 2021-09-28 NOTE — ED Triage Notes (Signed)
Patient complains of cough with green sputum and runny nose intermittently x 3 weeks. Has had negative flu and covid test. Patient in no distress

## 2021-09-29 ENCOUNTER — Institutional Professional Consult (permissible substitution): Payer: Self-pay | Admitting: Pulmonary Disease

## 2021-12-29 ENCOUNTER — Other Ambulatory Visit (HOSPITAL_COMMUNITY): Payer: Self-pay | Admitting: Internal Medicine

## 2021-12-29 ENCOUNTER — Ambulatory Visit (HOSPITAL_COMMUNITY)
Admission: RE | Admit: 2021-12-29 | Discharge: 2021-12-29 | Disposition: A | Discharge status: Home / Self Care | Payer: 59 | Source: Ambulatory Visit | Attending: Internal Medicine | Admitting: Internal Medicine

## 2021-12-29 DIAGNOSIS — R053 Chronic cough: Secondary | ICD-10-CM

## 2021-12-29 DIAGNOSIS — R0602 Shortness of breath: Secondary | ICD-10-CM | POA: Insufficient documentation

## 2022-02-09 ENCOUNTER — Other Ambulatory Visit: Payer: Self-pay

## 2022-02-09 ENCOUNTER — Encounter: Payer: Self-pay | Admitting: Allergy

## 2022-02-09 ENCOUNTER — Ambulatory Visit (INDEPENDENT_AMBULATORY_CARE_PROVIDER_SITE_OTHER): Payer: 59 | Admitting: Allergy

## 2022-02-09 VITALS — BP 124/86 | HR 69 | Temp 98.7°F | Resp 14 | Ht 63.0 in | Wt 139.4 lb

## 2022-02-09 DIAGNOSIS — J310 Chronic rhinitis: Secondary | ICD-10-CM | POA: Insufficient documentation

## 2022-02-09 DIAGNOSIS — R053 Chronic cough: Secondary | ICD-10-CM

## 2022-02-09 DIAGNOSIS — K219 Gastro-esophageal reflux disease without esophagitis: Secondary | ICD-10-CM | POA: Diagnosis not present

## 2022-02-09 MED ORDER — OMEPRAZOLE 40 MG PO CPDR: 40.0000 mg | DELAYED_RELEASE_CAPSULE | Freq: Every day | ORAL | 2 refills | Status: DC

## 2022-02-09 MED ORDER — METHYLPREDNISOLONE ACETATE 40 MG/ML IJ SUSP
40.0000 mg | Freq: Once | INTRAMUSCULAR | Status: AC
  Administered 2022-02-09: 40 mg via INTRAMUSCULAR

## 2022-02-09 MED ORDER — AZITHROMYCIN 250 MG PO TABS: ORAL_TABLET | ORAL | 0 refills | Status: DC

## 2022-02-27 NOTE — Progress Notes (Unsigned)
FOLLOW UP Date of Service/Encounter:  03/02/22   Subjective:  Adam Mcdaniel (DOB: 17-Feb-1991) is a 31 y.o. male who [facility] in re-evaluation of the following: Chronic cough, chronic rhinitis, reflux History obtained from: chart review and patient.  For Review, LV was on 02/09/22  with Dr. Selena Batten seen for intial visit for Chronic cough, chronic rhinitis, reflux . Treated with systemic steroids, antibiotics, PPI and daily inhaler.  Pertinent History/Diagnostics:  - Cough: Coughing with post tussive emesis, wheezing, shortness of breath x 6 months. Using albuterol 3-4 times per day with some benefit. Normal CXR in May 2023, negative TB testing. Patient concerned about allergic triggers. Denies PND. No prior Covid-19. Lost 20lbs  -  spirometry (02/09/22):  normal pattern with 5% improvement in FEV1 post bronchodilator treatment. Clinically feeling slightly improved - Chronic Rhinitis: Minimal symptoms x 8 months. Tried nasal spray and Singulair with no benefit. No prior allergy/ENT evaluation  - SPT environmental panel (02/09/22): Negative to indoor/outdoor allergens and common foods.  - Gastroesophageal reflux disease Feels like food gets stuck in esophagus at times.  Today presents for follow-up. Doing really well - He is much improved since last visit.   He still has some cough every day, and he feels like bread, peanut butter, butter milk, cause him to cough more.  Usually coughing occurs right after eating, but not always.  He is taking omeprazole in the morning and this helps some.   He does cough sometimes when not related to meals.   He does state when he is coughing, it feels like he needs to cough something out of his chest-then names the example of when he eats popcorn. He does not cough when he sleeps. Denies bolus sensation, food sticking, trouble swallowing or heartburn.   He does not feel benefit with the use of albuterol, but does  feel that using the air duo helps with his symptoms.  He did have a history of coughing intermittently when he was younger, but his current cough started about 6 months ago.  He was sick around the time cough started.  Allergies as of 03/02/2022   No Known Allergies      Medication List        Accurate as of March 02, 2022  1:20 PM. If you have any questions, ask your nurse or doctor.          STOP taking these medications    azithromycin 250 MG tablet Commonly known as: Zithromax Z-Pak       TAKE these medications    AirDuo Digihaler 113-14 MCG/ACT Aepb Generic drug: Fluticasone-Salmeterol(sensor) Inhale 1 puff into the lungs 2 (two) times daily.   albuterol 108 (90 Base) MCG/ACT inhaler Commonly known as: VENTOLIN HFA Inhale into the lungs.   Flovent Diskus 250 MCG/ACT Aepb Generic drug: Fluticasone Propionate (Inhal) Inhale into the lungs.   fluticasone 50 MCG/ACT nasal spray Commonly known as: FLONASE Place into both nostrils.   montelukast 10 MG tablet Commonly known as: SINGULAIR Take 10 mg by mouth daily.   omeprazole 40 MG capsule Commonly known as: PRILOSEC Take 1 capsule (40 mg total) by mouth 2 (two) times daily. What changed:  when to take this additional instructions Another medication with the same name was removed. Continue taking this medication, and follow the directions you see here.       Past Medical History:  Diagnosis Date   Angio-edema    No past surgical history  on file. Otherwise, there have been no changes to his past medical history, surgical history, family history, or social history.  ROS: All others negative except as noted per HPI.   Objective:  BP 118/62   Pulse 62   Temp 98.2 F (36.8 C) (Temporal)   Ht 5\' 3"  (1.6 m)   Wt 139 lb (63 kg)   SpO2 98%   BMI 24.62 kg/m  Body mass index is 24.62 kg/m. Physical Exam: General Appearance:  Alert, cooperative, no distress, appears stated age  Head:  Normocephalic,  without obvious abnormality, atraumatic  Eyes:  Conjunctiva clear, EOM's intact  Nose: Nares normal, normal mucosa, no visible anterior polyps, and septum midline  Throat: Lips, tongue normal; teeth and gums normal, normal posterior oropharynx  Neck: Supple, symmetrical  Lungs:   clear to auscultation bilaterally, Respirations unlabored, no coughing  Heart:  regular rate and rhythm and no murmur, Appears well perfused  Extremities: No edema  Skin: Skin color, texture, turgor normal, no rashes or lesions on visualized portions of skin  Neurologic: No gross deficits   Assessment/Plan  Chronic cough-improved since last visit.  Concern for uncontrolled reflux component based on today's history.  After thorough discussion, suspect a mixed upper and lower airway component to symptoms.  We will continue rest of plan as prior, but discussed increasing PPI and referral to GI for further evaluation given his daily persistent cough often related to meals.  Previous skin testing showed: Negative to indoor/outdoor allergens and common foods.   Coughing-improved, but still present  Increase omeprazole 40mg  in the morning and evening. Nothing to eat or drink for 30 minutes afterwards.  Daily controller medication(s): Continue airduo digihalr 1 puff twice a day. Rinse mouth after each use. Sample given and demonstrated proper use.  May use albuterol rescue inhaler 2 puffs or every 4 to 6 hours as needed for shortness of breath, chest tightness, coughing, and wheezing. May use albuterol rescue inhaler 2 puffs 5 to 15 minutes prior to strenuous physical activities. Monitor frequency of use.  Asthma control goals:  Full participation in all desired activities (may need albuterol before activity) Albuterol use two times or less a week on average (not counting use with activity) Cough interfering with sleep two times or less a month Oral steroids no more than once a year No hospitalizations    Rhinitis May use saline nasal spray as needed.   Follow up in 3 months or sooner if needed  Referral to Gastroenterology It was a pleasure meeting you in clinic today!   , MD  [facility]

## 2022-03-02 ENCOUNTER — Encounter: Payer: Self-pay | Admitting: Internal Medicine

## 2022-03-02 ENCOUNTER — Ambulatory Visit (INDEPENDENT_AMBULATORY_CARE_PROVIDER_SITE_OTHER): Payer: 59 | Admitting: Internal Medicine

## 2022-03-02 VITALS — BP 118/62 | HR 62 | Temp 98.2°F | Ht 63.0 in | Wt 139.0 lb

## 2022-03-02 DIAGNOSIS — R053 Chronic cough: Secondary | ICD-10-CM | POA: Diagnosis not present

## 2022-03-02 DIAGNOSIS — J310 Chronic rhinitis: Secondary | ICD-10-CM

## 2022-03-02 DIAGNOSIS — K219 Gastro-esophageal reflux disease without esophagitis: Secondary | ICD-10-CM

## 2022-03-02 MED ORDER — OMEPRAZOLE 40 MG PO CPDR: 40.0000 mg | DELAYED_RELEASE_CAPSULE | Freq: Two times a day (BID) | ORAL | 5 refills | Status: DC

## 2022-03-02 MED ORDER — AIRDUO DIGIHALER 113-14 MCG/ACT IN AEPB
1.0000 | INHALATION_SPRAY | Freq: Two times a day (BID) | RESPIRATORY_TRACT | 5 refills | Status: DC
Start: 2022-03-02 — End: 2022-03-03

## 2022-03-02 NOTE — Patient Instructions (Addendum)
Previous skin testing showed: Negative to indoor/outdoor allergens and common foods.   Coughing-improved, but still present  Increase omeprazole 40mg  in the morning and evening. Nothing to eat or drink for 30 minutes afterwards.  Daily controller medication(s): Continue airduo digihalr 1 puff twice a day. Rinse mouth after each use. Sample given and demonstrated proper use.  May use albuterol rescue inhaler 2 puffs or every 4 to 6 hours as needed for shortness of breath, chest tightness, coughing, and wheezing. May use albuterol rescue inhaler 2 puffs 5 to 15 minutes prior to strenuous physical activities. Monitor frequency of use.  Asthma control goals:  Full participation in all desired activities (may need albuterol before activity) Albuterol use two times or less a week on average (not counting use with activity) Cough interfering with sleep two times or less a month Oral steroids no more than once a year No hospitalizations   Rhinitis May use saline nasal spray as needed.   Follow up in 3 months or sooner if needed  Referral to Gastroenterology It was a pleasure meeting you in clinic today!  , MD [facility]

## 2022-03-03 ENCOUNTER — Telehealth: Payer: Self-pay

## 2022-03-03 MED ORDER — FLUTICASONE-SALMETEROL 115-21 MCG/ACT IN AERO: 2.0000 | INHALATION_SPRAY | Freq: Two times a day (BID) | RESPIRATORY_TRACT | 12 refills | Status: DC

## 2022-03-03 NOTE — Telephone Encounter (Signed)
Prefer Advair 115 HFA if covered- 2 puffs BID.  If not, Breo 100-1 puff daily.

## 2022-03-03 NOTE — Telephone Encounter (Signed)
Per pharmacy AirDuo Digihaler is not covered by patient's insurance. Preferred inhalers are: Advair, Luz Lex.  Please advise, thanks!

## 2022-03-03 NOTE — Telephone Encounter (Signed)
Rx changed and sent to pharmacy. Patient aware. Nothing further needed at this time.

## 2022-03-12 ENCOUNTER — Telehealth: Payer: Self-pay

## 2022-03-12 NOTE — Telephone Encounter (Signed)
Referral has been placed to the following: [facility] [address] [place], [place]: [phone] Patient has been informed and will call us back if he doesn't hear anything in the next 3-5 Business Days.    Patient states he feels like he is having frequent urination at night since starting the omeprazole. He is wanting to know if this is normal?   Thanks

## 2022-03-12 NOTE — Telephone Encounter (Signed)
-----   Message from Verlee Monte, MD sent at 03/02/2022  1:22 PM EDT ----- Please refer to GI for uncontrolled reflux.

## 2022-05-28 ENCOUNTER — Encounter: Payer: Self-pay | Admitting: Gastroenterology

## 2022-06-03 ENCOUNTER — Ambulatory Visit: Payer: 59 | Admitting: Internal Medicine

## 2022-06-03 ENCOUNTER — Encounter: Payer: Self-pay | Admitting: Family Medicine

## 2022-06-03 ENCOUNTER — Ambulatory Visit: Payer: 59 | Admitting: Family Medicine

## 2022-06-03 VITALS — BP 106/64 | HR 60 | Temp 97.0°F

## 2022-06-03 DIAGNOSIS — J4540 Moderate persistent asthma, uncomplicated: Secondary | ICD-10-CM | POA: Diagnosis not present

## 2022-06-03 DIAGNOSIS — J310 Chronic rhinitis: Secondary | ICD-10-CM

## 2022-06-03 DIAGNOSIS — R053 Chronic cough: Secondary | ICD-10-CM

## 2022-06-03 DIAGNOSIS — K219 Gastro-esophageal reflux disease without esophagitis: Secondary | ICD-10-CM | POA: Diagnosis not present

## 2022-06-03 MED ORDER — DULERA 200-5 MCG/ACT IN AERO: 2.0000 | INHALATION_SPRAY | Freq: Two times a day (BID) | RESPIRATORY_TRACT | 5 refills | Status: AC

## 2022-06-03 MED ORDER — OMEPRAZOLE 40 MG PO CPDR: 40.0000 mg | DELAYED_RELEASE_CAPSULE | Freq: Two times a day (BID) | ORAL | 5 refills | Status: AC

## 2022-06-03 NOTE — Patient Instructions (Addendum)
Asthma Begin Dulera 200-2 puffs twice a day with a spacer to prevent cough or wheeze.  This will replace Advair 115 for now Continue albuterol 2 puffs once every 4 hours as needed for cough or wheeze You may use albuterol 2 puffs 5 to 15 minutes before activity to decrease cough or wheeze  Chronic rhinitis Begin Flonase or Nasacort 2 sprays in each nostril once a day as needed for nasal congestion Consider saline nasal rinses as needed for nasal symptoms. Use this before any medicated nasal sprays for best result  Reflux with possible EOE Increase omeprazole to twice a day.  Take this medication 30 minutes before eating for best results Continue dietary and lifestyle modifications as listed below Keep your appointment with Dr.Cirigliano  [place] on 06/09/2022  Chronic cough Continue the treatment plan as listed above. Remember to keep your upcoming appointment with your gastroenterologist specialist Call the clinic if this treatment plan is not working well for you.  Follow up in 2 months or sooner if needed.

## 2022-06-03 NOTE — Progress Notes (Signed)
[address] [place] [address] Dept: [phone]  FOLLOW UP NOTE  Patient ID: Adam Mcdaniel, male    DOB: 1991-03-21  Age: 31 y.o. MRN: [id number] Date of Office Visit: 06/03/2022  Assessment  Chief Complaint: Cough  HPI Adam Mcdaniel is a 31 year old male who presents to the clinic for follow-up visit.  He was last seen in this clinic on 03/02/2022 by Dr. Maurine Minister for evaluation of chronic cough, chronic rhinitis, reflux, and asthma.  At today's visit, he reports that his asthma has been moderately well controlled with no shortness of breath or wheeze with activity or rest.  He continues to experience cough which is reported as dry and producing mucus.  He continues Advair 115-2 puffs twice a day and is not currently using albuterol.  He reports that his breathing was more well controlled withAirDuo 113, however, this is no longer available through his insurance.  Rhinitis is reported as moderately well controlled with occasional nasal congestion occurring especially with cold weather and copious postnasal drainage.  He occasionally uses nasal steroid spray and is not currently use a nasal saline rinse or taking an antihistamine.  His last environmental allergy skin testing was on 09/22/2021 and was negative to the panel.  He reports that he occasionally gets food stuck in his esophagus which causes him to increase coughing to remove this food from his esophagus.  He does report regurgitating or vomiting about 2 to 3 days a week.  He has an appointment with his gastroenterology specialist on 06/09/2022.  His last food allergy skin testing was on 02/09/2022 and was negative to the top 10 most allergenic foods.His current medications are listed in the chart.   Drug Allergies:  No Known Allergies  Physical Exam: BP 106/64   Pulse 60   Temp (!) 97 F (36.1 C)    Physical Exam Vitals reviewed.  Constitutional:      Appearance: Normal appearance.  HENT:     Head: Normocephalic and  atraumatic.     Right Ear: Tympanic membrane normal.     Left Ear: Tympanic membrane normal.     Nose:     Comments: Bilateral naris edematous and pale with clear nasal drainage noted.  Septal deviation noted.  Pharynx slightly erythematous with no exudate.  Ears normal.  Eyes normal. Eyes:     Conjunctiva/sclera: Conjunctivae normal.  Cardiovascular:     Rate and Rhythm: Normal rate and regular rhythm.     Heart sounds: Normal heart sounds. No murmur heard. Pulmonary:     Effort: Pulmonary effort is normal.     Breath sounds: Normal breath sounds.     Comments: Lungs clear to auscultation Musculoskeletal:        General: Normal range of motion.     Cervical back: Normal range of motion and neck supple.  Skin:    General: Skin is warm and dry.  Neurological:     Mental Status: He is alert and oriented to person, place, and time.  Psychiatric:        Mood and Affect: Mood normal.        Behavior: Behavior normal.        Thought Content: Thought content normal.        Judgment: Judgment normal.     Diagnostics: FVC 4.11, FEV1 3.32.  Predicted FVC 3.84, predicted FEV1 3.24.  Spirometry indicates normal ventilatory function.  Assessment and Plan: 1. Moderate persistent asthma without complication   2. Gastroesophageal reflux disease, unspecified  whether esophagitis present   3. Chronic cough   4. Nonallergic rhinitis     Meds ordered this encounter  Medications   mometasone-formoterol (DULERA) 200-5 MCG/ACT AERO    Sig: Inhale 2 puffs into the lungs 2 (two) times daily.    Dispense:  1 each    Refill:  5   omeprazole (PRILOSEC) 40 MG capsule    Sig: Take 1 capsule (40 mg total) by mouth in the morning and at bedtime.    Dispense:  60 capsule    Refill:  5    Patient Instructions  Asthma Begin Dulera 200-2 puffs twice a day with a spacer to prevent cough or wheeze.  This will replace Advair 115 for now Continue albuterol 2 puffs once every 4 hours as needed for cough  or wheeze You may use albuterol 2 puffs 5 to 15 minutes before activity to decrease cough or wheeze  Chronic rhinitis Begin Flonase or Nasacort 2 sprays in each nostril once a day as needed for nasal congestion Consider saline nasal rinses as needed for nasal symptoms. Use this before any medicated nasal sprays for best result  Reflux with possible EOE Increase omeprazole to twice a day.  Take this medication 30 minutes before eating for best results Continue dietary and lifestyle modifications as listed below Keep your appointment with Dr.Cirigliano  [place] on 06/09/2022  Chronic cough Continue the treatment plan as listed above. Remember to keep your upcoming appointment with your gastroenterologist specialist Call the clinic if this treatment plan is not working well for you.  Follow up in 2 months or sooner if needed.  Return in about 2 months (around 08/03/2022), or if symptoms worsen or fail to improve.    Thank you for the opportunity to care for this patient.  Please do not hesitate to contact me with questions.  Gareth Morgan, FNP [facility]

## 2022-06-09 ENCOUNTER — Ambulatory Visit: Payer: 59 | Admitting: Gastroenterology

## 2022-06-09 ENCOUNTER — Encounter: Payer: Self-pay | Admitting: Gastroenterology

## 2022-06-09 VITALS — BP 122/64 | HR 61 | Ht 66.0 in | Wt 145.4 lb

## 2022-06-09 DIAGNOSIS — R1110 Vomiting, unspecified: Secondary | ICD-10-CM

## 2022-06-09 DIAGNOSIS — J4540 Moderate persistent asthma, uncomplicated: Secondary | ICD-10-CM | POA: Diagnosis not present

## 2022-06-09 DIAGNOSIS — R053 Chronic cough: Secondary | ICD-10-CM

## 2022-06-09 DIAGNOSIS — R1310 Dysphagia, unspecified: Secondary | ICD-10-CM | POA: Diagnosis not present

## 2022-06-09 NOTE — Patient Instructions (Signed)
You have been scheduled for an endoscopy. Please follow written instructions given to you at your visit today. If you use inhalers (even only as needed), please bring them with you on the day of your procedure.  Due to recent changes in healthcare laws, you may see the results of your imaging and laboratory studies on MyChart before your provider has had a chance to review them.  We understand that in some cases there may be results that are confusing or concerning to you. Not all laboratory results come back in the same time frame and the provider may be waiting for multiple results in order to interpret others.  Please give us 48 hours in order for your provider to thoroughly review all the results before contacting the office for clarification of your results.    Thank you for choosing me and [facility].  Vito Cirigliano, D.O.    

## 2022-06-09 NOTE — Progress Notes (Signed)
Chief Complaint: GERD, regurgitation, dysphagia   Referring Provider:     Verlee Monte, MD   HPI:     Adam Mcdaniel is a 31 y.o. male with a history of moderate asthma referred to the Gastroenterology Clinic for evaluation of GERD, regurgitation, dysphagia.  Points to mid sternum and up to anterior neck. Has been present for 9 months or so. Occurs with solids only. Also with regurgitation. No HB. Has been taking omeprazole 40 mg for 3 months and initially worked for 2 weeks, then sxs started to recur.  No history of foot infection.  Does have a history of moderate asthma with chronic cough, chronic rhinitis.  Follows in the Allergy Clinic, last seen 06/03/2022 and increased omeprazole to 40 mg twice daily for possible overlapping EoE.  Environmental allergy skin testing negative in 09/2021 Food allergy testing negative in 01/2022.He reports asthma sxs improving after most recent Rx change last week.   No previous EGD or colonoscopy.   No known family history of CRC, GI malignancy, liver disease, pancreatic disease, or IBD.     Past Medical History:  Diagnosis Date   Angio-edema    Asthma      Past Surgical History:  Procedure Laterality Date   HERNIA REPAIR     History reviewed. No pertinent family history. Social History   Tobacco Use   Smoking status: Every Day   Smokeless tobacco: Never   Tobacco comments:    hooka  Substance Use Topics   Alcohol use: No   Drug use: No   Current Outpatient Medications  Medication Sig Dispense Refill   albuterol (VENTOLIN HFA) 108 (90 Base) MCG/ACT inhaler Inhale into the lungs.     fluticasone (FLONASE) 50 MCG/ACT nasal spray Place into both nostrils.     mometasone-formoterol (DULERA) 200-5 MCG/ACT AERO Inhale 2 puffs into the lungs 2 (two) times daily. 1 each 5   montelukast (SINGULAIR) 10 MG tablet Take 10 mg by mouth daily.     omeprazole (PRILOSEC) 40 MG capsule Take 1 capsule (40 mg total) by mouth in the  morning and at bedtime. 60 capsule 5   No current facility-administered medications for this visit.   No Known Allergies   Review of Systems: All systems reviewed and negative except where noted in HPI.     Physical Exam:    Wt Readings from Last 3 Encounters:  06/09/22 145 lb 6 oz (65.9 kg)  03/02/22 139 lb (63 kg)  02/09/22 139 lb 6.4 oz (63.2 kg)    BP 122/64   Pulse 61   Ht 5\' 6"  (1.676 m)   Wt 145 lb 6 oz (65.9 kg)   BMI 23.46 kg/m  Constitutional:  Pleasant, in no acute distress. Psychiatric: Normal mood and affect. Behavior is normal. Cardiovascular: Normal rate, regular rhythm. No edema Pulmonary/chest: Effort normal and breath sounds normal. No wheezing, rales or rhonchi. Abdominal: Soft, nondistended, nontender. Bowel sounds active throughout. There are no masses palpable. No hepatomegaly. Neurological: Alert and oriented to person place and time. Skin: Skin is warm and dry. No rashes noted.   ASSESSMENT AND PLAN;   1) Dysphagia 2) Regurgitation 31 year old male with 36-month history of progressive solid food dysphagia and intermittent regurgitation.  Elevated risk for EOE based on his underlying history of asthma and clinical description of symptoms. - Plan for expedited EGD with esophageal dilation and biopsies - Continue Prilosec as prescribed for the time  being.  We will hold off on starting new medication so do not confound results - Cut food into small pieces, eat small bites, chew food thoroughly and with plenty of liquids to avoid food impaction  3) Asthma 4) Chronic cough - Continue follow-up in the Asthma clinic - Can bring albuterol with him to the day of procedure  The indications, risks, and benefits of EGD were explained to the patient in detail. Risks include but are not limited to bleeding, perforation, adverse reaction to medications, and cardiopulmonary compromise. Sequelae include but are not limited to the possibility of surgery,  hospitalization, and mortality. The patient verbalized understanding and wished to proceed. All questions answered, referred to scheduler. Further recommendations pending results of the exam.      Lavena Bullion, DO, FACG  06/09/2022, 4:00 PM   Clemon Chambers, MD

## 2022-06-12 ENCOUNTER — Encounter: Payer: Self-pay | Admitting: Gastroenterology

## 2022-06-19 ENCOUNTER — Ambulatory Visit (AMBULATORY_SURGERY_CENTER): Payer: 59 | Admitting: Gastroenterology

## 2022-06-19 ENCOUNTER — Encounter: Payer: Self-pay | Admitting: Gastroenterology

## 2022-06-19 VITALS — BP 111/70 | HR 59 | Temp 97.3°F | Resp 17 | Ht 66.0 in | Wt 145.0 lb

## 2022-06-19 DIAGNOSIS — B9681 Helicobacter pylori [H. pylori] as the cause of diseases classified elsewhere: Secondary | ICD-10-CM | POA: Diagnosis not present

## 2022-06-19 DIAGNOSIS — K219 Gastro-esophageal reflux disease without esophagitis: Secondary | ICD-10-CM

## 2022-06-19 DIAGNOSIS — R053 Chronic cough: Secondary | ICD-10-CM

## 2022-06-19 DIAGNOSIS — K2950 Unspecified chronic gastritis without bleeding: Secondary | ICD-10-CM

## 2022-06-19 DIAGNOSIS — K2970 Gastritis, unspecified, without bleeding: Secondary | ICD-10-CM

## 2022-06-19 DIAGNOSIS — R1110 Vomiting, unspecified: Secondary | ICD-10-CM

## 2022-06-19 DIAGNOSIS — R1310 Dysphagia, unspecified: Secondary | ICD-10-CM | POA: Diagnosis present

## 2022-06-19 MED ORDER — SODIUM CHLORIDE 0.9 % IV SOLN: 500.0000 mL | Freq: Once | INTRAVENOUS | Status: DC

## 2022-06-19 NOTE — Patient Instructions (Signed)
Handout provided on gastritis.   Continue present medications. Await pathology results.   YOU HAD AN ENDOSCOPIC PROCEDURE TODAY AT [facility]:   Refer to the procedure report that was given to you for any specific questions about what was found during the examination.  If the procedure report does not answer your questions, please call your gastroenterologist to clarify.  If you requested that your care partner not be given the details of your procedure findings, then the procedure report has been included in a sealed envelope for you to review at your convenience later.  YOU SHOULD EXPECT: Some feelings of bloating in the abdomen. Passage of more gas than usual.  Walking can help get rid of the air that was put into your GI tract during the procedure and reduce the bloating. If you had a lower endoscopy (such as a colonoscopy or flexible sigmoidoscopy) you may notice spotting of blood in your stool or on the toilet paper. If you underwent a bowel prep for your procedure, you may not have a normal bowel movement for a few days.  Please Note:  You might notice some irritation and congestion in your nose or some drainage.  This is from the oxygen used during your procedure.  There is no need for concern and it should clear up in a day or so.  SYMPTOMS TO REPORT IMMEDIATELY:  Following upper endoscopy (EGD)  Vomiting of blood or coffee ground material  New chest pain or pain under the shoulder blades  Painful or persistently difficult swallowing  New shortness of breath  Fever of 100F or higher  Black, tarry-looking stools  For urgent or emergent issues, a gastroenterologist can be reached at any hour by calling [phone]. Do not use MyChart messaging for urgent concerns.    DIET:  We do recommend a small meal at first, but then you may proceed to your regular diet.  Drink plenty of fluids but you should avoid alcoholic beverages for 24 hours.  ACTIVITY:  You should  plan to take it easy for the rest of today and you should NOT DRIVE or use heavy machinery until tomorrow (because of the sedation medicines used during the test).    FOLLOW UP: Our staff will call the number listed on your records the next business day following your procedure.  We will call around 7:15- 8:00 am to check on you and address any questions or concerns that you may have regarding the information given to you following your procedure. If we do not reach you, we will leave a message.     If any biopsies were taken you will be contacted by phone or by letter within the next 1-3 weeks.  Please call us at [phone] if you have not heard about the biopsies in 3 weeks.    SIGNATURES/CONFIDENTIALITY: You and/or your care partner have signed paperwork which will be entered into your electronic medical record.  These signatures attest to the fact that that the information above on your After Visit Summary has been reviewed and is understood.  Full responsibility of the confidentiality of this discharge information lies with you and/or your care-partner.

## 2022-06-19 NOTE — Progress Notes (Signed)
Called to room to assist during endoscopic procedure.  Patient ID and intended procedure confirmed with present staff. Received instructions for my participation in the procedure from the performing physician.  

## 2022-06-19 NOTE — Progress Notes (Signed)
To pacu, VSS. Report to [email or web address] 

## 2022-06-19 NOTE — Progress Notes (Signed)
GASTROENTEROLOGY PROCEDURE H&P NOTE   Primary Care Physician: Antonietta Jewel, MD    Reason for Procedure:   GERD, regurgitation, dysphagia   Plan:    EGD  Patient is appropriate for endoscopic procedure(s) in the ambulatory ([facility]) setting.  The nature of the procedure, as well as the risks, benefits, and alternatives were carefully and thoroughly reviewed with the patient. Ample time for discussion and questions allowed. The patient understood, was satisfied, and agreed to proceed.     HPI: Adam Mcdaniel is a 31 y.o. male who presents for EGD for evaluation of GERD, regurgitation, dysphagia  .  Patient was most recently seen in the Gastroenterology Clinic on 06/09/2022 by me.  No interval change in medical history since that appointment. Please refer to that note for full details regarding GI history and clinical presentation.   Past Medical History:  Diagnosis Date   Angio-edema    Asthma     Past Surgical History:  Procedure Laterality Date   HERNIA REPAIR      Prior to Admission medications   Medication Sig Start Date End Date Taking? Authorizing Provider  albuterol (VENTOLIN HFA) 108 (90 Base) MCG/ACT inhaler Inhale into the lungs. 01/15/22  Yes [provider]  omeprazole (PRILOSEC) 40 MG capsule Take 1 capsule (40 mg total) by mouth in the morning and at bedtime. 06/03/22  Yes Ambs, Kathrine Cords, FNP  fluticasone (FLONASE) 50 MCG/ACT nasal spray Place into both nostrils. 10/21/21   [provider]  mometasone-formoterol (DULERA) 200-5 MCG/ACT AERO Inhale 2 puffs into the lungs 2 (two) times daily. Patient not taking: Reported on 06/19/2022 06/03/22   Dara Hoyer, FNP  montelukast (SINGULAIR) 10 MG tablet Take 10 mg by mouth daily. 01/15/22   [provider]    Current Outpatient Medications  Medication Sig Dispense Refill   albuterol (VENTOLIN HFA) 108 (90 Base) MCG/ACT inhaler Inhale into the lungs.     omeprazole (PRILOSEC) 40 MG capsule Take 1  capsule (40 mg total) by mouth in the morning and at bedtime. 60 capsule 5   fluticasone (FLONASE) 50 MCG/ACT nasal spray Place into both nostrils.     mometasone-formoterol (DULERA) 200-5 MCG/ACT AERO Inhale 2 puffs into the lungs 2 (two) times daily. (Patient not taking: Reported on 06/19/2022) 1 each 5   montelukast (SINGULAIR) 10 MG tablet Take 10 mg by mouth daily.     Current Facility-Administered Medications  Medication Dose Route Frequency Provider Last Rate Last Admin   0.9 %  sodium chloride infusion  500 mL Intravenous Once Sefora Tietje V, DO        Allergies as of 06/19/2022   (No Known Allergies)    History reviewed. No pertinent family history.  Social History   Socioeconomic History   Marital status: Married    Spouse name: Not on file   Number of children: 2   Years of education: Not on file   Highest education level: Not on file  Occupational History   Not on file  Tobacco Use   Smoking status: Some Days    Types: Cigars   Smokeless tobacco: Never   Tobacco comments:    hooka  Substance and Sexual Activity   Alcohol use: No   Drug use: No   Sexual activity: Not on file  Other Topics Concern   Not on file  Social History Narrative   Not on file   Social Determinants of Health   Financial Resource Strain: Not on file  Food  Insecurity: Not on file  Transportation Needs: Not on file  Physical Activity: Not on file  Stress: Not on file  Social Connections: Not on file  Intimate Partner Violence: Not on file    Physical Exam: Vital signs in last 24 hours: @BP  108/71   Pulse 60   Temp (!) 97.3 F (36.3 C) (Temporal)   Resp 15   Ht 5\' 6"  (1.676 m)   Wt 145 lb (65.8 kg)   SpO2 100%   BMI 23.40 kg/m  GEN: NAD EYE: Sclerae anicteric ENT: MMM CV: Non-tachycardic Pulm: CTA b/l GI: Soft, NT/ND NEURO:  Alert & Oriented x 3   Gerrit Heck, DO [facility]   06/19/2022 2:24 PM

## 2022-06-19 NOTE — Op Note (Signed)
[facility] Patient Name: Adam Mcdaniel Procedure Date: 06/19/2022 2:17 PM MRN: [id number] Endoscopist: Doristine Locks , MD, [id number] Age: 31 Referring MD:  Date of Birth: 06-04-1991 Gender: Male Account #: [id number] Procedure:                Upper GI endoscopy Indications:              Solid food Dysphagia, Regurgitation x9 months.                           Chronic cough and history of Asthma.                           Currently treated with Prilosec 40 mg BID. Medicines:                Monitored Anesthesia Care Procedure:                Pre-Anesthesia Assessment:                           - Prior to the procedure, a History and Physical                            was performed, and patient medications and                            allergies were reviewed. The patient's tolerance of                            previous anesthesia was also reviewed. The risks                            and benefits of the procedure and the sedation                            options and risks were discussed with the patient.                            All questions were answered, and informed consent                            was obtained. Prior Anticoagulants: The patient has                            taken no anticoagulant or antiplatelet agents. ASA                            Grade Assessment: II - A patient with mild systemic                            disease. After reviewing the risks and benefits,                            the patient was deemed in satisfactory condition to  undergo the procedure.                           After obtaining informed consent, the endoscope was                            passed under direct vision. Throughout the                            procedure, the patient's blood pressure, pulse, and                            oxygen saturations were monitored continuously. The                            Endoscope was introduced  through the mouth, and                            advanced to the third part of duodenum. The upper                            GI endoscopy was accomplished without difficulty.                            The patient tolerated the procedure well. Scope In: Scope Out: Findings:                 Aside from faint longitudinal furrows in the                            proximal and mid esophagus, the examined esophagus                            was normal. The scope was withdrawn. Dilation was                            performed with a Maloney dilator with no resistance                            at 54 Fr. The dilation site was examined following                            endoscope reinsertion and showed no bleeding,                            mucosal tear or perforation. Biopsies were then                            obtained from the proximal and distal esophagus                            with cold forceps for histology of suspected  eosinophilic esophagitis. No "tug sign" with                            biopsies. Estimated blood loss was minimal.                           The Z-line was regular and was found 38 cm from the                            incisors.                           Mild inflammation characterized by congestion                            (edema) was found in the gastric fundus and in the                            gastric body. Biopsies were taken with a cold                            forceps for Helicobacter pylori testing. Estimated                            blood loss was minimal.                           The incisura, gastric antrum and pylorus were                            normal.                           The examined duodenum was normal. Complications:            No immediate complications. Estimated Blood Loss:     Estimated blood loss was minimal. Impression:               - Normal esophagus. Dilated with 54 Fr Maloney                             dilator without mucosal rent. Biopsies were then                            taken with a cold forceps for evaluation of                            eosinophilic esophagitis.                           - Z-line regular, 38 cm from the incisors.                           - Mild non-ulcer gastritis. Biopsied.                           -  Normal incisura, antrum and pylorus.                           - Normal examined duodenum. Recommendation:           - Patient has a contact number available for                            emergencies. The signs and symptoms of potential                            delayed complications were discussed with the                            patient. Return to normal activities tomorrow.                            Written discharge instructions were provided to the                            patient.                           - Resume previous diet.                           - Continue present medications.                           - Await pathology results. Gerrit Heck, MD 06/19/2022 2:49:11 PM

## 2022-06-22 ENCOUNTER — Telehealth: Payer: Self-pay

## 2022-06-22 NOTE — Telephone Encounter (Signed)
Attempted f/u call. VM not setup.

## 2022-07-06 ENCOUNTER — Other Ambulatory Visit: Payer: Self-pay

## 2022-07-06 DIAGNOSIS — B9681 Helicobacter pylori [H. pylori] as the cause of diseases classified elsewhere: Secondary | ICD-10-CM

## 2022-08-03 ENCOUNTER — Encounter: Payer: Self-pay | Admitting: Family Medicine

## 2022-08-03 ENCOUNTER — Other Ambulatory Visit: Payer: Self-pay

## 2022-08-03 ENCOUNTER — Ambulatory Visit (INDEPENDENT_AMBULATORY_CARE_PROVIDER_SITE_OTHER): Payer: 59 | Admitting: Family Medicine

## 2022-08-03 VITALS — BP 120/72 | HR 72 | Temp 98.4°F | Resp 20 | Ht 66.0 in | Wt 144.0 lb

## 2022-08-03 DIAGNOSIS — J310 Chronic rhinitis: Secondary | ICD-10-CM

## 2022-08-03 DIAGNOSIS — K9049 Malabsorption due to intolerance, not elsewhere classified: Secondary | ICD-10-CM | POA: Diagnosis not present

## 2022-08-03 DIAGNOSIS — T7800XA Anaphylactic reaction due to unspecified food, initial encounter: Secondary | ICD-10-CM

## 2022-08-03 DIAGNOSIS — J4540 Moderate persistent asthma, uncomplicated: Secondary | ICD-10-CM

## 2022-08-03 DIAGNOSIS — K219 Gastro-esophageal reflux disease without esophagitis: Secondary | ICD-10-CM

## 2022-08-03 MED ORDER — EPINEPHRINE 0.3 MG/0.3ML IJ SOAJ: 0.3000 mg | Freq: Once | INTRAMUSCULAR | 2 refills | Status: AC

## 2022-08-03 NOTE — Patient Instructions (Addendum)
Asthma Continue albuterol 2 puffs once every 4 hours as needed for cough or wheeze You may use albuterol 2 puffs 5 to 15 minutes before activity to decrease cough or wheeze For asthma flare, begin Dulera 200-2 puffs twice a day for 2 weeks or until cough and wheeze free, then stop  Chronic rhinitis May take cetirizine 10 mg once a day as needed for a runny nose or itch Continue Flonase or Nasacort 2 sprays in each nostril once a day as needed for nasal congestion Consider saline nasal rinses as needed for nasal symptoms. Use this before any medicated nasal sprays for best result  Reflux  Continue dietary and lifestyle modifications as listed below Continue to follow up with with Dr.Cirigliano for management of reflux and  h pylori  Food allergy Continue to avoid peanuts and tree nuts. We have placed a lab order to help [place] evaluate your food allergies. We will call you when the results become available.  In case of an allergic reaction, take Benadryl 50 mg every 4 hours, and if life-threatening symptoms occur, inject with EpiPen 0.3 mg.   Call the clinic if this treatment plan is not working well for you.  Follow up in 6 months or sooner if needed.   Lifestyle Changes for Controlling GERD When you have GERD, stomach acid feels as if it's backing up toward your mouth. Whether or not you take medication to control your GERD, your symptoms can often be improved with lifestyle changes.   Raise Your Head Reflux is more likely to strike when you're lying down flat, because stomach fluid can flow backward more easily. Raising the head of your bed 4-6 inches can help. To do this: Slide blocks or books under the legs at the head of your bed. Or, place a wedge under the mattress. Many foam stores can make a suitable wedge for you. The wedge should run from your waist to the top of your head. Don't just prop your head on several pillows. This increases pressure on your stomach. It can make  GERD worse.  Watch Your Eating Habits Certain foods may increase the acid in your stomach or relax the lower esophageal sphincter, making GERD more likely. It's best to avoid the following: Coffee, tea, and carbonated drinks (with and without caffeine) Fatty, fried, or spicy food Mint, chocolate, onions, and tomatoes Any other foods that seem to irritate your stomach or cause you pain  Relieve the Pressure Eat smaller meals, even if you have to eat more often. Don't lie down right after you eat. Wait a few hours for your stomach to empty. Avoid tight belts and tight-fitting clothes. Lose excess weight.  Tobacco and Alcohol Avoid smoking tobacco and drinking alcohol. They can make GERD symptoms worse.

## 2022-08-03 NOTE — Progress Notes (Signed)
[address] [place] [address] Dept: [phone]  FOLLOW UP NOTE  Patient ID: Adam Mcdaniel, male    DOB: [birth year]  Age: 31 y.o. MRN: [id number] Date of Office Visit: 08/03/2022  Assessment  Chief Complaint: Follow-up (Follow up no concerns.)  HPI Adam Mcdaniel is a 31 year old male who presents to the clinic for follow-up visit.  He was last seen in this clinic on 06/03/2022 by Thermon Leyland, FNP, for evaluation of asthma, chronic rhinitis, reflux, and cough.  At today's visit, he reports his asthma has been moderately well-controlled with occasional intermittent dry cough.  He denies symptoms including shortness of breath with rest or activity or wheeze.  He is not currently using Dulera 200 and has not used his albuterol since his last visit to this clinic.  Allergic rhinitis is reported as moderately well controlled with occasional nasal congestion as the main symptom.  He is not currently using an antihistamine, nasal saline spray, or nasal steroid spray.  His last environmental allergy skin testing was on 02/09/2022 and was negative to the adult environmental panel.  He does report that he is avoiding peanuts and tree nuts at this time due to abdominal pain and vomiting after eating any type of nut.  His last food allergy testing was on 02/09/2022 and was positive to the top 10 most allergenic foods.  He did see Dr. Barron Alvine for evaluation of dysphagia with endoscopy on 06/19/2022.  He continues omeprazole 40 mg twice a day.  His current medications are listed in the chart.   Drug Allergies:  No Known Allergies  Physical Exam: BP 120/72   Pulse 72   Temp 98.4 F (36.9 C)   Resp 20   Ht 5\' 6"  (1.676 m)   Wt 144 lb (65.3 kg)   SpO2 97%   BMI 23.24 kg/m    Physical Exam Vitals reviewed.  Constitutional:      Appearance: Normal appearance.  HENT:     Head: Normocephalic and atraumatic.     Right Ear: Tympanic membrane normal.     Left Ear: Tympanic membrane normal.      Nose:     Comments: Bilateral naris slightly erythematous with clear nasal drainage noted.  Pharynx normal.  Ears normal.  Eyes normal.    Mouth/Throat:     Pharynx: Oropharynx is clear.  Eyes:     Conjunctiva/sclera: Conjunctivae normal.  Cardiovascular:     Rate and Rhythm: Normal rate and regular rhythm.     Heart sounds: Normal heart sounds. No murmur heard. Pulmonary:     Effort: Pulmonary effort is normal.     Breath sounds: Normal breath sounds.     Comments: Lungs clear to auscultation Musculoskeletal:        General: Normal range of motion.     Cervical back: Normal range of motion and neck supple.  Skin:    General: Skin is warm and dry.  Neurological:     Mental Status: He is alert and oriented to person, place, and time.  Psychiatric:        Mood and Affect: Mood normal.        Behavior: Behavior normal.        Thought Content: Thought content normal.        Judgment: Judgment normal.     Diagnostics: FVC 5.29 which is 123% of predicted value, FEV1 3.85 which is 107% of predicted value.  Spirometry indicates airway obstruction.  I have a bad habit printing it  and forgetting it  Assessment and Plan: 1. Moderate persistent asthma without complication   2. Allergy with anaphylaxis due to food   3. Nonallergic rhinitis   4. Gastroesophageal reflux disease, unspecified whether esophagitis present      Patient Instructions  Asthma Continue albuterol 2 puffs once every 4 hours as needed for cough or wheeze You may use albuterol 2 puffs 5 to 15 minutes before activity to decrease cough or wheeze For asthma flare, begin Dulera 200-2 puffs twice a day for 2 weeks or until cough and wheeze free, then stop  Chronic rhinitis May take cetirizine 10 mg once a day as needed for a runny nose or itch Continue Flonase or Nasacort 2 sprays in each nostril once a day as needed for nasal congestion Consider saline nasal rinses as needed for nasal symptoms. Use this before any  medicated nasal sprays for best result  Reflux  Continue dietary and lifestyle modifications as listed below Continue to follow up with with Dr.Cirigliano for management of reflux and  h pylori  Food allergy Continue to avoid peanuts and tree nuts. We have placed a lab order to help [place] evaluate your food allergies. We will call you when the results become available.  In case of an allergic reaction, take Benadryl 50 mg every 4 hours, and if life-threatening symptoms occur, inject with EpiPen 0.3 mg.  Call the clinic if this treatment plan is not working well for you.  Follow up in 6 months or sooner if needed.   Return in about 6 months (around 02/02/2023), or if symptoms worsen or fail to improve.    Thank you for the opportunity to care for this patient.  Please do not hesitate to contact me with questions.  Thermon Leyland, FNP [facility]

## 2022-08-05 LAB — ALLERGENS(7)
Brazil Nut IgE: <0.1 kU/L
F020-IgE Almond: <0.1 kU/L
F202-IgE Cashew Nut: 0.14 kU/L — AB
Hazelnut (Filbert) IgE: <0.1 kU/L
Pecan Nut IgE: <0.1 kU/L
Walnut IgE: <0.1 kU/L

## 2022-08-05 LAB — IGE PEANUT W/COMPONENT REFLEX: Peanut, IgE: <0.1 kU/L

## 2022-08-05 NOTE — Progress Notes (Signed)
Can you please let this patient know that his lab results are available. His peanut IgE was negative and his tree nuts were negative with the exception of cashew which was borderline positive. Please have him continue to avoid these foods. He may come to the clinic for a food challenge to peanuts and tree nuts on separate days if he is interested in adding these foods back into his diet.

## 2022-08-06 ENCOUNTER — Other Ambulatory Visit: Payer: 59

## 2022-08-07 ENCOUNTER — Telehealth: Payer: Self-pay

## 2022-08-07 ENCOUNTER — Other Ambulatory Visit: Payer: 59

## 2022-08-07 DIAGNOSIS — B9681 Helicobacter pylori [H. pylori] as the cause of diseases classified elsewhere: Secondary | ICD-10-CM

## 2022-08-07 NOTE — Telephone Encounter (Signed)
Spoke with pt and let pt know about H. Pylori stool test. Pt stated that he already picked up kit and would bring sample by today.

## 2022-08-07 NOTE — Telephone Encounter (Signed)
-----   Message from Orion Modest, RN sent at 07/06/2022  3:19 PM EST ----- Pt needs H. Pylori stool test. Order in epic.

## 2022-08-09 LAB — H. PYLORI ANTIGEN, STOOL: H pylori Ag, Stl: NEGATIVE

## 2022-08-24 ENCOUNTER — Other Ambulatory Visit: Payer: Self-pay

## 2022-08-24 ENCOUNTER — Ambulatory Visit: Payer: 59 | Admitting: Family Medicine

## 2022-08-24 ENCOUNTER — Encounter: Payer: Self-pay | Admitting: Family Medicine

## 2022-08-24 VITALS — BP 100/70 | HR 60 | Temp 97.9°F | Resp 16 | Ht 66.54 in | Wt 137.6 lb

## 2022-08-24 DIAGNOSIS — T7801XA Anaphylactic reaction due to peanuts, initial encounter: Secondary | ICD-10-CM | POA: Insufficient documentation

## 2022-08-24 DIAGNOSIS — T7801XD Anaphylactic reaction due to peanuts, subsequent encounter: Secondary | ICD-10-CM

## 2022-08-24 MED ORDER — EPINEPHRINE 0.3 MG/0.3ML IJ SOAJ: 0.3000 mg | Freq: Once | INTRAMUSCULAR | 2 refills | Status: AC

## 2022-08-24 NOTE — Patient Instructions (Signed)
In office oral peanut butter challenge Adam Mcdaniel was able to tolerate the peanut butter food challenge today at the office without adverse signs or symptoms of an allergic reaction. Therefore, he has the same risk of systemic reaction associated with the consumption of peanut or peanut containing products as the general population.  - Do not give any peanut or peanut containing products  for the next 24 hours. - Monitor for allergic symptoms such as rash, wheezing, diarrhea, swelling, and vomiting for the next 24 hours. If severe symptoms occur, treat with EpiPen injection and call 911. For less severe symptoms treat with Benadryl 50 mg every 4 hours and call the clinic.  - If no allergic symptoms are evident, reintroduce peanut into the diet. If you develop an allergic reaction to peanut or peanut containing products , record what was eaten the amount eaten, preparation method, time from ingestion to reaction, and symptoms.   Food allergy Continue to avoid tree nuts.  In case of an allergic reaction, take Benadryl 50 mg every 4 hours, and if life-threatening symptoms occur, inject with EpiPen 0.3 mg.  Call the clinic if this treatment plan is not working well for you  Follow up in 6 months or sooner if needed.

## 2022-08-24 NOTE — Progress Notes (Signed)
[address] [place] [address] Dept: [phone]  FOLLOW UP NOTE  Patient ID: Adam Mcdaniel, male    DOB: 09/04/1990  Age: 32 y.o. MRN: [id number] Date of Office Visit: 08/24/2022  Assessment  Chief Complaint: Food/Drug Challenge (No concerns)  HPI Adam Mcdaniel is a 32 year old male who presents to the clinic for follow-up visit.  He was last seen in this clinic on 08/03/2022 by Thermon Leyland, FNP, for evaluation of asthma, chronic rhinitis, reflux, and food allergy to peanut and tree nut.  Chart review indicates labs from 08/03/2022 are negative for peanut IgE and negative for tree nut IgE with the exception of cashew which is 0.14.  Skin prick testing for peanut and cashew were both negative on 02/09/2022. At today's visit, he reports that he has been feeling well overall, however, has a slight cough that began this morning. He denies integumentary and gastrointestinal symptoms. He has not had any antihistamines over the last 3 days. His current medications are listed in the chart.    Drug Allergies:  No Known Allergies  Physical Exam: BP 100/70   Pulse 60   Temp 97.9 F (36.6 C)   Resp 16   Ht 5' 6.54" (1.69 m)   Wt 137 lb 9.6 oz (62.4 kg)   SpO2 97%   BMI 21.85 kg/m    Physical Exam Vitals reviewed.  Constitutional:      Appearance: Normal appearance.  HENT:     Head: Normocephalic and atraumatic.     Right Ear: Tympanic membrane normal.     Left Ear: Tympanic membrane normal.     Nose:     Comments: Bilateral nares slightly erythematous with clear nasal drainage noted. Pharynx normal. Ears normal. Eyes normal.    Mouth/Throat:     Pharynx: Oropharynx is clear.  Eyes:     Conjunctiva/sclera: Conjunctivae normal.  Cardiovascular:     Rate and Rhythm: Normal rate and regular rhythm.     Heart sounds: Normal heart sounds. No murmur heard. Pulmonary:     Effort: Pulmonary effort is normal.     Breath sounds: Normal breath sounds.     Comments: Lungs clear to  auscultation Musculoskeletal:        General: Normal range of motion.     Cervical back: Normal range of motion and neck supple.  Skin:    General: Skin is warm and dry.  Neurological:     Mental Status: He is alert and oriented to person, place, and time.  Psychiatric:        Mood and Affect: Mood normal.        Behavior: Behavior normal.        Thought Content: Thought content normal.        Judgment: Judgment normal.    Procedure note: Written consent obtained Open graded peanut butter oral challenge: The patient was able to tolerate the challenge today without adverse signs or symptoms. Vital signs were stable throughout the challenge and observation period. He received multiple doses separated by 15 minutes, each of which was separated by vitals and a brief physical exam. He received the following doses: lip rub, 1 gm, 2 gm, 4 gm, 8 gm, and 16 gm. He was monitored for 60 minutes following the last dose.  Total testing time: 152 minutes  The patient had negative skin prick test and sIgE tests to peanut  and was able to tolerate the open graded oral challenge today without adverse signs or symptoms. Therefore, he  has the same risk of systemic reaction associated with the consumption of peanut  as the general population.    Assessment and Plan: 1. Peanut-induced anaphylaxis, subsequent encounter     Meds ordered this encounter  Medications   EPINEPHrine (EPIPEN 2-PAK) 0.3 mg/0.3 mL IJ SOAJ injection    Sig: Inject 0.3 mg into the muscle once for 1 dose.    Dispense:  1 each    Refill:  2    Patient Instructions  In office oral peanut butter challenge [name] was able to tolerate the peanut butter food challenge today at the office without adverse signs or symptoms of an allergic reaction. Therefore, he has the same risk of systemic reaction associated with the consumption of peanut or peanut containing products as the general population.  - Do not give any peanut or  peanut containing products  for the next 24 hours. - Monitor for allergic symptoms such as rash, wheezing, diarrhea, swelling, and vomiting for the next 24 hours. If severe symptoms occur, treat with EpiPen injection and call 911. For less severe symptoms treat with Benadryl 50 mg every 4 hours and call the clinic.  - If no allergic symptoms are evident, reintroduce peanut into the diet. If you develop an allergic reaction to peanut or peanut containing products , record what was eaten the amount eaten, preparation method, time from ingestion to reaction, and symptoms.   Food allergy Continue to avoid tree nuts.  In case of an allergic reaction, take Benadryl 50 mg every 4 hours, and if life-threatening symptoms occur, inject with EpiPen 0.3 mg.  Call the clinic if this treatment plan is not working well for you  Follow up in 6 months or sooner if needed.    Return in about 6 months (around 02/22/2023), or if symptoms worsen or fail to improve.    Thank you for the opportunity to care for this patient.  Please do not hesitate to contact me with questions.  Gareth Morgan, FNP [facility] [facility]

## 2023-01-25 ENCOUNTER — Ambulatory Visit
Admission: EM | Admit: 2023-01-25 | Discharge: 2023-01-25 | Disposition: A | Discharge status: Home / Self Care | Payer: 59

## 2023-01-25 DIAGNOSIS — M25529 Pain in unspecified elbow: Secondary | ICD-10-CM | POA: Diagnosis not present

## 2023-01-25 DIAGNOSIS — M721 Knuckle pads: Secondary | ICD-10-CM

## 2023-01-25 NOTE — Discharge Instructions (Signed)
If you want to have your arm pain looked at further, try getting seen at one of the sports medicine clinics listed below  For the knuckle pads (the excess skin growth on your knuckles), there really isn't a specific treatment and there are no proven home treatments for knuckle pads. However, keeping the skin on your hands and feet moisturized may help reduce discomfort. When washing your hands:  Use lukewarm, not hot, water. Gently pat skin dry after washing -- don't rub. Choose fragrance-free, dye-free skin creams and soaps. Apply moisturizers that contain emollients, such as petroleum jelly, to your hands immediately after washing them.  You can also see a dermatologist for your knuckle pads.

## 2023-01-25 NOTE — ED Triage Notes (Signed)
Pt presents with c/o muscle pain when moving a lot and requests an rx for calluses.

## 2023-01-26 NOTE — ED Provider Notes (Signed)
[facility]    CSN: [id number] Arrival date & time: 01/25/23  1558      History   Chief Complaint Chief Complaint  Patient presents with   Muscle Pain   Callouses    HPI Ostin Mathey is a 32 y.o. male. 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 No numbness. No pain at this time. Only pain when he's using them a lot at work. Also c/o of calluses on the dorsal surace of his hand B, on the knuckles. Denies doing any motions at home or work that would rub his knuckles against another surface. They are not painful, he just wants them removed because he doesn't like the way they look.    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 Authorizing Provider  albuterol (VENTOLIN HFA) 108 (90 Base) MCG/ACT inhaler Inhale into the lungs. 01/15/22   [provider]  fluticasone (FLONASE) 50 MCG/ACT nasal spray Place into both nostrils. 10/21/21   [provider]  mometasone-formoterol (DULERA) 200-5 MCG/ACT AERO Inhale 2 puffs into the lungs 2 (two) times daily. 06/03/22   Hetty Blend, FNP  montelukast (SINGULAIR) 10 MG tablet Take 10 mg by mouth daily. Patient not taking: Reported on 08/24/2022 01/15/22   [provider]  omeprazole (PRILOSEC) 40 MG capsule Take 1 capsule (40 mg total) by mouth in the morning and at bedtime.  06/03/22   Hetty Blend, FNP    Family History History reviewed. No pertinent family history.  Social History Social History   Tobacco Use   Smoking status: Some Days    Types: Cigars   Smokeless tobacco: Never   Tobacco comments:    hooka  Substance Use Topics   Alcohol use: No   Drug use: No     Allergies   Patient has no known allergies.   Review of Systems Review of Systems   Physical Exam Triage Vital Signs ED Triage Vitals [01/25/23 1623]  Enc Vitals Group     BP 104/69     Pulse Rate (!) 54     Resp 17     Temp 98.6 F (37 C)     Temp Source Oral     SpO2 97 %     Weight      Height      Head Circumference      Peak Flow      Pain Score 0     Pain Loc      Pain Edu?      Excl. in GC?    No data found.  Updated Vital Signs BP 104/69 (BP Location: Left Arm)   Pulse (!) 54   Temp 98.1 F (36.7 C)   Resp 17   SpO2 97%  Visual Acuity Right Eye Distance:   Left Eye Distance:   Bilateral Distance:    Right Eye Near:   Left Eye Near:    Bilateral Near:     Physical Exam Musculoskeletal:     Right upper arm: Normal.     Left upper arm: Normal.     Right elbow: Normal.     Left elbow: Normal.  Skin:    Comments: Thickened fibrous skin over the dorsal MCP joint areas of 2 knuckles on L hand and one on R hand. Non painful. No inflammation or edema/erythema noted.       UC Treatments / Results  Labs (all labs ordered are listed, but only abnormal results are displayed) Labs Reviewed - No data to display  EKG   Radiology No results found.  Procedures Procedures (including critical care time)  Medications Ordered in UC Medications - No data to display  Initial Impression / Assessment and Plan / UC Course  I have reviewed the triage vital signs and the nursing notes.  Pertinent labs & imaging results that were available during my care of the patient were reviewed by me and considered in my medical decision making (see chart  for details).     Upper arm exam B is normal. I suspect the pain he feels when using his arms for longer periods of time at work is simply muscle soreness or fatigue. I encouraged him to f/u with sports medicine if it continues to bother him but reassured him it's probably normal.   Explained knuckle pads and that they are not harmful. Discussed potential skin care options and suggested f/u with derm if they bother him  Final Clinical Impressions(s) / UC Diagnoses   Final diagnoses:  Knuckle pads  Arthralgia of upper arm, unspecified laterality     Discharge Instructions      If you want to have your arm pain looked at further, try getting seen at one of the sports medicine clinics listed below  For the knuckle pads (the excess skin growth on your knuckles), there really isn't a specific treatment and there are no proven home treatments for knuckle pads. However, keeping the skin on your hands and feet moisturized may help reduce discomfort. When washing your hands:  Use lukewarm, not hot, water. Gently pat skin dry after washing -- don't rub. Choose fragrance-free, dye-free skin creams and soaps. Apply moisturizers that contain emollients, such as petroleum jelly, to your hands immediately after washing them.  You can also see a dermatologist for your knuckle pads.    ED Prescriptions   None    PDMP not reviewed this encounter.   Cathlyn Parsons, NP 01/26/23 [phone]

## 2023-02-01 ENCOUNTER — Ambulatory Visit: Payer: 59 | Admitting: Family Medicine
